# Patient Record
Sex: Male | Born: 1990 | Race: Black or African American | Hispanic: No | Marital: Single
Health system: Southern US, Academic
[De-identification: ages and names within clinical notes are randomized; demographics above are authoritative.]

## PROBLEM LIST (undated history)

## (undated) ENCOUNTER — Ambulatory Visit
Payer: MEDICAID | Attending: Student in an Organized Health Care Education/Training Program | Primary: Student in an Organized Health Care Education/Training Program

## (undated) ENCOUNTER — Encounter: Attending: Family | Primary: Family

## (undated) ENCOUNTER — Telehealth

## (undated) ENCOUNTER — Encounter

## (undated) ENCOUNTER — Telehealth: Attending: Family | Primary: Family

## (undated) ENCOUNTER — Ambulatory Visit: Payer: PRIVATE HEALTH INSURANCE | Attending: Family | Primary: Family

## (undated) ENCOUNTER — Ambulatory Visit: Payer: Medicaid (Managed Care)

## (undated) ENCOUNTER — Ambulatory Visit

## (undated) ENCOUNTER — Ambulatory Visit: Payer: MEDICAID | Attending: Family | Primary: Family

## (undated) ENCOUNTER — Ambulatory Visit: Attending: Internal Medicine | Primary: Internal Medicine

## (undated) ENCOUNTER — Encounter: Attending: Psychiatry | Primary: Psychiatry

## (undated) ENCOUNTER — Ambulatory Visit: Payer: MEDICAID

## (undated) ENCOUNTER — Encounter
Attending: Student in an Organized Health Care Education/Training Program | Primary: Student in an Organized Health Care Education/Training Program

## (undated) ENCOUNTER — Encounter: Payer: Medicaid (Managed Care) | Attending: Medical | Primary: Medical

## (undated) ENCOUNTER — Ambulatory Visit: Attending: Family | Primary: Family

## (undated) ENCOUNTER — Ambulatory Visit: Payer: Medicaid (Managed Care) | Attending: Family | Primary: Family

## (undated) ENCOUNTER — Ambulatory Visit
Attending: Student in an Organized Health Care Education/Training Program | Primary: Student in an Organized Health Care Education/Training Program

## (undated) ENCOUNTER — Telehealth: Attending: Clinical | Primary: Clinical

## (undated) ENCOUNTER — Ambulatory Visit: Attending: Addiction (Substance Use Disorder) | Primary: Addiction (Substance Use Disorder)

## (undated) ENCOUNTER — Encounter: Payer: BLUE CROSS/BLUE SHIELD | Attending: Family | Primary: Family

## (undated) ENCOUNTER — Encounter: Payer: Medicaid (Managed Care) | Attending: Psychiatry | Primary: Psychiatry

## (undated) ENCOUNTER — Ambulatory Visit
Payer: PRIVATE HEALTH INSURANCE | Attending: Student in an Organized Health Care Education/Training Program | Primary: Student in an Organized Health Care Education/Training Program

## (undated) DIAGNOSIS — I10 Essential (primary) hypertension: Secondary | ICD-10-CM

## (undated) HISTORY — PX: ANKLE SURGERY: SHX546

## (undated) HISTORY — PX: APPENDECTOMY: SHX54

## (undated) HISTORY — PX: MENISCUS REPAIR: SHX5179

## (undated) HISTORY — PX: HERNIA REPAIR: SHX51

---
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
## 2017-03-30 ENCOUNTER — Emergency Department
Admission: EM | Admit: 2017-03-30 | Discharge: 2017-03-30 | Disposition: A | Discharge status: Home / Self Care | Source: Intra-hospital

## 2017-03-30 DIAGNOSIS — M25562 Pain in left knee: Principal | ICD-10-CM

## 2017-05-25 ENCOUNTER — Emergency Department
Admission: EM | Admit: 2017-05-25 | Discharge: 2017-05-25 | Disposition: A | Discharge status: Home / Self Care | Source: Intra-hospital

## 2017-05-25 ENCOUNTER — Emergency Department
Admission: EM | Admit: 2017-05-25 | Discharge: 2017-05-25 | Disposition: A | Discharge status: Home / Self Care | Source: Intra-hospital | Attending: Family | Admitting: Family

## 2017-05-25 DIAGNOSIS — R1011 Right upper quadrant pain: Principal | ICD-10-CM

## 2017-05-25 DIAGNOSIS — R1013 Epigastric pain: Secondary | ICD-10-CM

## 2017-05-25 MED ORDER — RANITIDINE 150 MG TABLET
ORAL_TABLET | Freq: Two times a day (BID) | ORAL | 0 refills | 0.00000 days | Status: CP
Start: 2017-05-25 — End: 2017-06-04

## 2017-05-30 ENCOUNTER — Emergency Department (HOSPITAL_COMMUNITY): Payer: Self-pay

## 2017-05-30 ENCOUNTER — Emergency Department (HOSPITAL_COMMUNITY)
Admission: EM | Admit: 2017-05-30 | Discharge: 2017-05-30 | Disposition: A | Discharge status: Home / Self Care | Payer: Self-pay | Attending: Emergency Medicine | Admitting: Emergency Medicine

## 2017-05-30 ENCOUNTER — Encounter (HOSPITAL_COMMUNITY): Payer: Self-pay | Admitting: Emergency Medicine

## 2017-05-30 DIAGNOSIS — R0989 Other specified symptoms and signs involving the circulatory and respiratory systems: Secondary | ICD-10-CM | POA: Insufficient documentation

## 2017-05-30 DIAGNOSIS — J020 Streptococcal pharyngitis: Secondary | ICD-10-CM | POA: Insufficient documentation

## 2017-05-30 LAB — RAPID STREP SCREEN (MED CTR MEBANE ONLY): STREPTOCOCCUS, GROUP A SCREEN (DIRECT): POSITIVE — AB

## 2017-05-30 MED ORDER — ACETAMINOPHEN 325 MG PO TABS
650.0000 mg | ORAL_TABLET | Freq: Once | ORAL | Status: AC | PRN
  Administered 2017-05-30: 650 mg via ORAL

## 2017-05-30 MED ORDER — PENICILLIN G BENZATHINE 1200000 UNIT/2ML IM SUSP
1.2000 10*6.[IU] | Freq: Once | INTRAMUSCULAR | Status: AC
  Administered 2017-05-30: 1.2 10*6.[IU] via INTRAMUSCULAR
  Filled 2017-05-30: qty 2

## 2017-05-30 MED ORDER — ACETAMINOPHEN 325 MG PO TABS
ORAL_TABLET | ORAL | Status: AC
  Filled 2017-05-30: qty 2

## 2017-05-30 MED ORDER — FLUTICASONE PROPIONATE 50 MCG/ACT NA SUSP: 1.0000 | Freq: Every day | NASAL | 0 refills | Status: DC

## 2017-05-30 NOTE — ED Notes (Signed)
PT states understanding of care given, follow up care, and medication prescribed. PT is ambulated from ED to car with a steady gait.  

## 2017-05-30 NOTE — ED Notes (Signed)
Pt tried to take Sudafed and alkerselcer  and nothing has helped.

## 2017-05-30 NOTE — ED Provider Notes (Signed)
[facility]-EMERGENCY DEPT Provider Note   CSN: [id number] Arrival date & time: 05/30/17  1500     History   Chief Complaint Chief Complaint  Patient presents with  . URI    HPI Jordan Grimes is a 26 y.o. male.  HPI  26 y.o. male, presents to the Emergency Department today due to URI symptoms x 2 days. Associated cough. Congestion, and sore throat. Notes cough is non productive. Notes sister sick last week with strep throat. No fevers. No CP/SOB. No headaches. No trouble swallowing. Able to tolerate PO. Denies pain. No meds PTA. No N/V/D. No other symptoms noted.    History reviewed. No pertinent past medical history.  There are no active problems to display for this patient.   History reviewed. No pertinent surgical history.     Home Medications    Prior to Admission medications   Not on File    Family History History reviewed. No pertinent family history.  Social History Social History  Substance Use Topics  . Smoking status: Never Smoker  . Smokeless tobacco: Never Used  . Alcohol use Yes     Comment: social     Allergies   Patient has no allergy information on record.   Review of Systems Review of Systems ROS reviewed and all are negative for acute change except as noted in the HPI.  Physical Exam Updated Vital Signs BP (!) 143/91 (BP Location: Right Arm)   Pulse 96   Temp 99.6 F (37.6 C) (Oral)   Resp 20   Ht  (1.854 m)   Wt 114.8 kg (253 lb)   SpO2 99%   BMI 33.38 kg/m   Physical Exam  Constitutional: He is oriented to person, place, and time. He appears well-developed and well-nourished. No distress.  HENT:  Head: Normocephalic and atraumatic.  Right Ear: Tympanic membrane, external ear and ear canal normal.  Left Ear: Tympanic membrane, external ear and ear canal normal.  Nose: Nose normal.  Mouth/Throat: Uvula is midline and mucous membranes are normal. No trismus in the jaw. Oropharyngeal exudate and posterior  oropharyngeal erythema present. No tonsillar abscesses.  Eyes: Pupils are equal, round, and reactive to light. EOM are normal.  Neck: Normal range of motion. Neck supple. No tracheal deviation present.  Cardiovascular: Normal rate, regular rhythm, S1 normal, S2 normal, normal heart sounds, intact distal pulses and normal pulses.   Pulmonary/Chest: Effort normal and breath sounds normal. No respiratory distress. He has no decreased breath sounds. He has no wheezes. He has no rhonchi. He has no rales.  Abdominal: Normal appearance and bowel sounds are normal. There is no tenderness.  Musculoskeletal: Normal range of motion.  Neurological: He is alert and oriented to person, place, and time.  Skin: Skin is warm and dry.  Psychiatric: He has a normal mood and affect. His speech is normal and behavior is normal. Thought content normal.  Nursing note and vitals reviewed.    ED Treatments / Results  Labs (all labs ordered are listed, but only abnormal results are displayed) Labs Reviewed  RAPID STREP SCREEN (NOT AT [facility]) - Abnormal; Notable for the following:       Result Value   Streptococcus, Group A Screen (Direct) POSITIVE (*)    All other components within normal limits    EKG  EKG Interpretation None       Radiology Dg Chest 2 View  Result Date: 05/30/2017 CLINICAL DATA:  Chest pain, shortness of breath and productive cough since  this morning. EXAM: CHEST  2 VIEW COMPARISON:  None. FINDINGS: Poor inspiration. Normal sized heart. Mild central peribronchial thickening. No airspace consolidation. Unremarkable bones. IMPRESSION: Mild bronchitic changes. Electronically Signed   By: Beckie Salts M.D.   On: 05/30/2017 16:13    Procedures Procedures (including critical care time)  Medications Ordered in ED Medications  acetaminophen (TYLENOL) 325 MG tablet (not administered)  acetaminophen (TYLENOL) tablet 650 mg (650 mg Oral Given 05/30/17 1547)     Initial Impression /  Assessment and Plan / ED Course  I have reviewed the triage vital signs and the nursing notes.  Pertinent labs & imaging results that were available during my care of the patient were reviewed by me and considered in my medical decision making (see chart for details).  Final Clinical Impressions(s) / ED Diagnoses   {I have reviewed and evaluated the relevant imaging studies.  {I have reviewed the relevant previous healthcare records.  {I obtained HPI from historian.   ED Course:  Assessment:Pt with positive strep. Given Bicillin IM infection. Discharge with symptomatic tx. No evidence of dehydration. Pt is tolerating secretions. Presentation not concerning for peritonsillar abscess or spread of infection to deep spaces of the throat; patent airway. Specific return precautions discussed. Recommended PCP follow up. Pt appears safe for discharge.   Disposition/Plan:  DC Home Additional Verbal discharge instructions given and discussed with patient.  Pt Instructed to f/u with PCP in the next week for evaluation and treatment of symptoms. Return precautions given Pt acknowledges and agrees with plan  Supervising Physician Vanetta Mulders, MD  Final diagnoses:  Strep pharyngitis    New Prescriptions New Prescriptions   No medications on file     Audry Pili, Cordelia Poche 05/30/17 1804    Vanetta Mulders, MD 05/31/17 1651

## 2017-05-30 NOTE — ED Triage Notes (Signed)
Pt presents to ED for assessment of cough, cold chills, sore throat, headache, and body aches x 2 days.

## 2017-05-30 NOTE — Discharge Instructions (Signed)
Please read and follow all provided instructions.  Your diagnoses today include:  1. Strep pharyngitis     Tests performed today include: Vital signs. See below for your results today.   Medications prescribed:  Take as prescribed   Home care instructions:  Follow any educational materials contained in this packet.  Follow-up instructions: Please follow-up with your primary care provider for further evaluation of symptoms and treatment   Return instructions:  Please return to the Emergency Department if you do not get better, if you get worse, or new symptoms OR  - Fever (temperature greater than 101.14F)  - Bleeding that does not stop with holding pressure to the area    -Severe pain (please note that you may be more sore the day after your accident)  - Chest Pain  - Difficulty breathing  - Severe nausea or vomiting  - Inability to tolerate food and liquids  - Passing out  - Skin becoming red around your wounds  - Change in mental status (confusion or lethargy)  - New numbness or weakness    Please return if you have any other emergent concerns.  Additional Information:  Your vital signs today were: BP (!) 143/91 (BP Location: Right Arm)    Pulse 96    Temp 99.6 F (37.6 C) (Oral)    Resp 20    Ht  (1.854 m)    Wt 114.8 kg (253 lb)    SpO2 99%    BMI 33.38 kg/m  If your blood pressure (BP) was elevated above 135/85 this visit, please have this repeated by your doctor within one month. ---------------

## 2017-06-12 ENCOUNTER — Emergency Department (HOSPITAL_COMMUNITY)
Admission: EM | Admit: 2017-06-12 | Discharge: 2017-06-12 | Disposition: A | Discharge status: Home / Self Care | Payer: Self-pay | Attending: Emergency Medicine | Admitting: Emergency Medicine

## 2017-06-12 ENCOUNTER — Emergency Department (HOSPITAL_COMMUNITY): Payer: Self-pay

## 2017-06-12 ENCOUNTER — Encounter (HOSPITAL_COMMUNITY): Payer: Self-pay | Admitting: *Deleted

## 2017-06-12 DIAGNOSIS — J40 Bronchitis, not specified as acute or chronic: Secondary | ICD-10-CM | POA: Insufficient documentation

## 2017-06-12 DIAGNOSIS — L0201 Cutaneous abscess of face: Secondary | ICD-10-CM | POA: Insufficient documentation

## 2017-06-12 DIAGNOSIS — L0291 Cutaneous abscess, unspecified: Secondary | ICD-10-CM

## 2017-06-12 MED ORDER — LIDOCAINE HCL 2 % IJ SOLN
10.0000 mL | Freq: Once | INTRAMUSCULAR | Status: AC
  Administered 2017-06-12: 200 mg
  Filled 2017-06-12: qty 20

## 2017-06-12 MED ORDER — LIDOCAINE-EPINEPHRINE-TETRACAINE (LET) SOLUTION
3.0000 mL | Freq: Once | NASAL | Status: AC
  Administered 2017-06-12: 3 mL via TOPICAL
  Filled 2017-06-12: qty 3

## 2017-06-12 MED ORDER — BENZONATATE 100 MG PO CAPS: 100.0000 mg | ORAL_CAPSULE | Freq: Three times a day (TID) | ORAL | 0 refills | Status: DC

## 2017-06-12 NOTE — ED Triage Notes (Signed)
To ED for eval of URI symptoms and also raised area to left eyebrow without drainage.

## 2017-06-12 NOTE — ED Provider Notes (Signed)
[facility] EMERGENCY DEPARTMENT Provider Note   CSN: [id number] Arrival date & time: 06/12/17  1520     History   Chief Complaint Chief Complaint  Patient presents with  . Abscess  . Nasal Congestion    HPI Jordan Grimes is a 26 y.o. male.  HPI   Jordan Grimes is a 26 y.o. male, patient with recent diagnosis of strep with treatment, presenting to the ED with productive cough with nasal congestion for last week.  Also complains of a bump over left eye brow for last three days. Not growing. Tender to palpation, but no pain otherwise. No drainage. Tetanus up-to-date Denies chest pain, shortness of breath, N/V/D, fever/chills, sore throat, or any other complaints.   History reviewed. No pertinent past medical history.  There are no active problems to display for this patient.   History reviewed. No pertinent surgical history.     Home Medications    Prior to Admission medications   Medication Sig Start Date End Date Taking? Authorizing Provider  benzonatate (TESSALON) 100 MG capsule Take 1 capsule (100 mg total) by mouth every 8 (eight) hours. 06/12/17   Gladyce Mcray C, PA-C  fluticasone (FLONASE) 50 MCG/ACT nasal spray Place 1 spray into both nostrils daily. 05/30/17   Audry Pili, PA-C    Family History No family history on file.  Social History Social History  Substance Use Topics  . Smoking status: Never Smoker  . Smokeless tobacco: Never Used  . Alcohol use Yes     Comment: social     Allergies   Patient has no known allergies.   Review of Systems Review of Systems  Constitutional: Negative for chills and fever.  HENT: Positive for congestion. Negative for facial swelling, sore throat, trouble swallowing and voice change.   Eyes: Negative for pain, redness and visual disturbance.  Respiratory: Positive for cough. Negative for shortness of breath.   Skin:       Tender bump above left eyebrow  Neurological: Negative  for dizziness, weakness, light-headedness, numbness and headaches.  All other systems reviewed and are negative.    Physical Exam Updated Vital Signs BP (!) 153/97 (BP Location: Left Arm)   Pulse 79   Temp 98.4 F (36.9 C) (Oral)   Resp 19   SpO2 97%   Physical Exam  Constitutional: He appears well-developed and well-nourished. No distress.  HENT:  Head: Normocephalic and atraumatic.  Nose: Mucosal edema present.  Eyes: Pupils are equal, round, and reactive to light. Conjunctivae and EOM are normal.  Neck: Neck supple.  Cardiovascular: Normal rate, regular rhythm, normal heart sounds and intact distal pulses.   Pulmonary/Chest: Effort normal and breath sounds normal. No respiratory distress.  No increased work of breathing.  Patient speaks in full sentences without difficulty.  Abdominal: Soft. There is no tenderness. There is no guarding.  Musculoskeletal: He exhibits no edema.  Lymphadenopathy:    He has no cervical adenopathy.  Neurological: He is alert.  Skin: Skin is warm and dry. Capillary refill takes less than 2 seconds. He is not diaphoretic.  1.5 cm tender, fluctuant mass superior to left eyebrow.  No surrounding edema or erythema noted.  Psychiatric: He has a normal mood and affect. His behavior is normal.  Nursing note and vitals reviewed.    ED Treatments / Results  Labs (all labs ordered are listed, but only abnormal results are displayed) Labs Reviewed - No data to display  EKG  EKG Interpretation None  Radiology Results for orders placed or performed during the hospital encounter of 05/30/17  Rapid strep screen  Result Value Ref Range   Streptococcus, Group A Screen (Direct) POSITIVE (A) NEGATIVE   Dg Chest 2 View  Result Date: 06/12/2017 CLINICAL DATA:  Productive cough EXAM: CHEST  2 VIEW COMPARISON:  05/30/2017 FINDINGS: Minimal bronchitic changes. No consolidation or effusion. Normal heart size. No pneumothorax. IMPRESSION: Mild  bronchitic changes.  No focal pulmonary infiltrate. Electronically Signed   By: Jasmine PangKim  Fujinaga M.D.   On: 06/12/2017 18:07   Dg Chest 2 View  Result Date: 05/30/2017 CLINICAL DATA:  Chest pain, shortness of breath and productive cough since this morning. EXAM: CHEST  2 VIEW COMPARISON:  None. FINDINGS: Poor inspiration. Normal sized heart. Mild central peribronchial thickening. No airspace consolidation. Unremarkable bones. IMPRESSION: Mild bronchitic changes. Electronically Signed   By: Beckie SaltsSteven  Reid M.D.   On: 05/30/2017 16:13    Procedures US bedside Date/Time: 06/12/2017 5:38 PM Performed by: Anselm PancoastJOY, Viraat Vanpatten C Authorized by: Harolyn RutherfordJOY, Ritika Hellickson C  Consent: Verbal consent obtained. Risks and benefits: risks, benefits and alternatives were discussed Consent given by: patient Patient identity confirmed: verbally with patient and provided demographic data Local anesthesia used: no  Anesthesia: Local anesthesia used: no  Sedation: Patient sedated: no Comments: Fluid filled 1.5cm mass, suspicious for abscess, with no evidence of surrounding cellulitis.  Marland Kitchen..Incision and Drainage Date/Time: 06/12/2017 5:39 PM Performed by: Anselm PancoastJOY, Dohn Stclair C Authorized by: Anselm PancoastJOY, Essie Gehret C   Consent:    Consent obtained:  Verbal   Consent given by:  Patient   Risks discussed:  Infection, bleeding, pain and incomplete drainage Location:    Type:  Abscess   Size:  1.5 cm   Location:  Head   Head location:  Face Pre-procedure details:    Skin preparation:  Chloraprep Anesthesia (see MAR for exact dosages):    Anesthesia method:  Local infiltration   Local anesthetic:  Lidocaine 2% w/o epi Procedure type:    Complexity:  Simple Procedure details:    Needle aspiration: yes     Needle size:  18 G   Incision types:  Stab incision   Incision depth:  Subcutaneous   Scalpel blade:  11   Drainage:  Purulent   Drainage amount:  Scant   Wound treatment:  Wound left open   Packing materials:  None Post-procedure details:     Patient tolerance of procedure:  Tolerated well, no immediate complications   (including critical care time)  Medications Ordered in ED Medications  lidocaine-EPINEPHrine-tetracaine (LET) solution (3 mLs Topical Given 06/12/17 1804)  lidocaine (XYLOCAINE) 2 % (with pres) injection 200 mg (200 mg Infiltration Given 06/12/17 1805)     Initial Impression / Assessment and Plan / ED Course  I have reviewed the triage vital signs and the nursing notes.  Pertinent labs & imaging results that were available during my care of the patient were reviewed by me and considered in my medical decision making (see chart for details).     Patient presents with symptoms consistent with URI versus bronchitis, viral origin still suspected.  Facial abscess identified and confirmed with ultrasound.  I&D without immediate complication.  Antibiotic therapy not initiated due to no evidence of surrounding cellulitis.  Case management assistance utilized to help patient secure a PCP. The patient was given instructions for home care as well as return precautions. Patient voices understanding of these instructions, accepts the plan, and is comfortable with discharge.     Final Clinical  Impressions(s) / ED Diagnoses   Final diagnoses:  Bronchitis  Abscess    New Prescriptions Discharge Medication List as of 06/12/2017  7:53 PM    START taking these medications   Details  benzonatate (TESSALON) 100 MG capsule Take 1 capsule (100 mg total) by mouth every 8 (eight) hours., Starting Thu 06/12/2017, Print         Adriel Desrosier C, PA-C 06/15/17 1607    Gerhard Munch, MD 06/16/17 [id number]

## 2017-06-12 NOTE — Discharge Instructions (Addendum)
Your symptoms are consistent with a viral illness. Viruses do not require antibiotics. Treatment is symptomatic care and it is important to note that these symptoms may last for 7-14 days.   Hand washing: Wash your hands throughout the day, but especially before and after touching the face, using the restroom, sneezing, coughing, or touching surfaces that have been coughed or sneezed upon. Hydration: Symptoms will be intensified and complicated by dehydration. Dehydration can also extend the duration of symptoms. Drink plenty of fluids and get plenty of rest. You should be drinking at least half a liter of water an hour to stay hydrated. Electrolyte drinks are also encouraged. You should be drinking enough fluids to make your urine light yellow, almost clear. If this is not the case, you are not drinking enough water. Please note that some of the treatments indicated below will not be effective if you are not adequately hydrated. Pain or fever: Ibuprofen, Naproxen, or Tylenol for pain or fever.  Cough: Use the Tessalon for cough.  Congestion: Plain Mucinex may help relieve congestion. Saline sinus rinses and saline nasal sprays may also help relieve congestion. If you do not have heart problems or an allergy to such medications, you may also try phenylephrine or Sudafed. Sore throat: Warm liquids or Chloraseptic spray may help soothe a sore throat. Gargle twice a day with a salt water solution made from a half teaspoon of salt in a cup of warm water.  Follow up: Follow up with a primary care provider, as needed, for any future management of this issue.  You may remove the bandage after 24 hours if the wound has stopped draining, otherwise replace the bandage with a clean one. Clean the wound and surrounding area gently with tap water and mild soap. Rinse well and blot dry. You may shower, but avoid submerging the wound, such as with a bath or swimming. Clean the wound daily to prevent infection. Do not  use cleaners such as hydrogen peroxide or alcohol.   Scar reduction: Application of a topical antibiotic ointment, such as Neosporin, after the wound has begun to close and heal well can decrease scab formation and reduce scarring. After the wound has healed and wound closures have been removed, application of ointments such as Aquaphor can also reduce scar formation.  The key to scar reduction is keeping the skin well hydrated and supple. Drinking plenty of water throughout the day (At least eight 8oz glasses of water a day) is essential to staying well hydrated.  Sun exposure: Keep the wound out of the sun. After the wound has healed, continue to protect it from the sun by wearing protective clothing or applying sunscreen.  Pain: You may use Tylenol, naproxen, or ibuprofen for pain.  You may return to the ED for a wound check in 48 hours.  Return to the ED sooner further signs of infection arise, such as spreading redness, puffiness/swelling, severe increase in pain, fever over 100.1F, or any other major issues.

## 2017-06-12 NOTE — Progress Notes (Signed)
ED CM noted patient to have had 2 ED visits in the past 6 months and no PCP or health insurance listed. CM met with patient at bedside to discuss assistance with access to care. Patient reports not having health insurance or a PCP at this time. Discussed the [facility] and the services rendered patient is agreeable with scheduling a follow up and establishing care at the clinic. Patient given clinic contact information for follow up tomorrow morning. Patient verbalized understanding and teach back done.  Updated Pod C EDP. No further ED CM needs identified.

## 2017-09-05 ENCOUNTER — Encounter (HOSPITAL_BASED_OUTPATIENT_CLINIC_OR_DEPARTMENT_OTHER): Payer: Self-pay

## 2017-09-05 ENCOUNTER — Other Ambulatory Visit: Payer: Self-pay

## 2017-09-05 ENCOUNTER — Emergency Department (HOSPITAL_BASED_OUTPATIENT_CLINIC_OR_DEPARTMENT_OTHER)
Admission: EM | Admit: 2017-09-05 | Discharge: 2017-09-05 | Disposition: A | Discharge status: Home / Self Care | Payer: Self-pay | Attending: Emergency Medicine | Admitting: Emergency Medicine

## 2017-09-05 DIAGNOSIS — R5383 Other fatigue: Secondary | ICD-10-CM | POA: Insufficient documentation

## 2017-09-05 DIAGNOSIS — J069 Acute upper respiratory infection, unspecified: Secondary | ICD-10-CM | POA: Insufficient documentation

## 2017-09-05 DIAGNOSIS — R0989 Other specified symptoms and signs involving the circulatory and respiratory systems: Secondary | ICD-10-CM | POA: Insufficient documentation

## 2017-09-05 DIAGNOSIS — J029 Acute pharyngitis, unspecified: Secondary | ICD-10-CM | POA: Insufficient documentation

## 2017-09-05 LAB — RAPID STREP SCREEN (MED CTR MEBANE ONLY): Streptococcus, Group A Screen (Direct): NEGATIVE

## 2017-09-05 NOTE — ED Provider Notes (Signed)
[facility] EMERGENCY DEPARTMENT Provider Note   CSN: [id number] Arrival date & time: 09/05/17  1949     History   Chief Complaint Chief Complaint  Patient presents with  . Cough    HPI Jordan Grimes is a 27 y.o. male who presents for  Sxs of URI. Began yesterday. Cough, associated fatigue, runny nose, sore throat. No fever, sob.  HPI  History reviewed. No pertinent past medical history.  There are no active problems to display for this patient.   Past Surgical History:  Procedure Laterality Date  . ANKLE SURGERY    . APPENDECTOMY    . HERNIA REPAIR    . MENISCUS REPAIR         Home Medications    Prior to Admission medications   Not on File    Family History No family history on file.  Social History Social History   Tobacco Use  . Smoking status: Never Smoker  . Smokeless tobacco: Never Used  Substance Use Topics  . Alcohol use: Yes    Comment: occ  . Drug use: No     Allergies   Patient has no known allergies.   Review of Systems Review of Systems  Constitutional: Positive for fatigue. Negative for appetite change, chills and fever.  HENT: Positive for congestion and sore throat. Negative for trouble swallowing and voice change.   Respiratory: Positive for cough. Negative for shortness of breath and wheezing.      Physical Exam Updated Vital Signs BP (!) 152/96 (BP Location: Right Arm)   Pulse 88   Temp 99 F (37.2 C) (Oral)   Resp 18   Ht 6\' 1"  (1.854 m)   Wt 115.2 kg (254 lb)   SpO2 99%   BMI 33.51 kg/m   Physical Exam  Constitutional: He appears well-developed and well-nourished. No distress.  HENT:  Head: Normocephalic and atraumatic.  Mouth/Throat: Posterior oropharyngeal erythema present. Tonsils are 2+ on the right. Tonsils are 2+ on the left. No tonsillar exudate.  Eyes: EOM are normal. Pupils are equal, round, and reactive to light. Right conjunctiva is injected. Left conjunctiva is injected. No  scleral icterus.  Neck: Normal range of motion. Neck supple.  Cardiovascular: Normal rate, regular rhythm and normal heart sounds.  Pulmonary/Chest: Effort normal and breath sounds normal. No respiratory distress.  Abdominal: Soft. There is no tenderness.  Musculoskeletal: He exhibits no edema.  Neurological: He is alert.  Skin: Skin is warm and dry. He is not diaphoretic.  Psychiatric: His behavior is normal.  Nursing note and vitals reviewed.    ED Treatments / Results  Labs (all labs ordered are listed, but only abnormal results are displayed) Labs Reviewed - No data to display  EKG  EKG Interpretation None       Radiology No results found.  Procedures Procedures (including critical care time)  Medications Ordered in ED Medications - No data to display   Initial Impression / Assessment and Plan / ED Course  I have reviewed the triage vital signs and the nursing notes.  Pertinent labs & imaging results that were available during my care of the patient were reviewed by me and considered in my medical decision making (see chart for details).     Negative strep. Pt CXR negative for acute infiltrate. Patients symptoms are consistent with URI, likely viral etiology. Discussed that antibiotics are not indicated for viral infections. Pt will be discharged with symptomatic treatment.  Verbalizes understanding and is agreeable with  plan. Pt is hemodynamically stable & in NAD prior to dc.    Final Clinical Impressions(s) / ED Diagnoses   Final diagnoses:  Upper respiratory tract infection, unspecified type    ED Discharge Orders    None       Arthor Captain, PA-C 09/05/17 2118    Gwyneth Sprout, MD 09/06/17 1101

## 2017-09-05 NOTE — ED Triage Notes (Signed)
C/o flu like sx day 2-NAD-steady gait 

## 2017-09-05 NOTE — Discharge Instructions (Signed)
You appear to have an upper respiratory infection (URI). An upper respiratory tract infection, or cold, is a viral infection of the air passages leading to the lungs. It is contagious and can be spread to others, especially during the first 3 or 4 days. It cannot be cured by antibiotics or other medicines. °RETURN IMMEDIATELY IF you develop shortness of breath, confusion or altered mental status, a new rash, become dizzy, faint, or poorly responsive, or are unable to be cared for at home. ° °

## 2017-09-08 LAB — CULTURE, GROUP A STREP (THRC)

## 2017-11-28 ENCOUNTER — Encounter (HOSPITAL_BASED_OUTPATIENT_CLINIC_OR_DEPARTMENT_OTHER): Payer: Self-pay

## 2017-11-28 ENCOUNTER — Emergency Department (HOSPITAL_BASED_OUTPATIENT_CLINIC_OR_DEPARTMENT_OTHER)
Admission: EM | Admit: 2017-11-28 | Discharge: 2017-11-28 | Disposition: A | Discharge status: Home / Self Care | Payer: Worker's Compensation | Attending: Emergency Medicine | Admitting: Emergency Medicine

## 2017-11-28 ENCOUNTER — Emergency Department (HOSPITAL_BASED_OUTPATIENT_CLINIC_OR_DEPARTMENT_OTHER): Payer: Worker's Compensation

## 2017-11-28 ENCOUNTER — Other Ambulatory Visit: Payer: Self-pay

## 2017-11-28 DIAGNOSIS — M79641 Pain in right hand: Secondary | ICD-10-CM

## 2017-11-28 DIAGNOSIS — I10 Essential (primary) hypertension: Secondary | ICD-10-CM

## 2017-11-28 NOTE — Discharge Instructions (Signed)
While in the ED your blood pressure was high.  Please follow up with your primary care doctor or the wellness clinic for repeat evaluation as you may need medication.  High blood pressure can cause long term, potentially serious, damage if left untreated.  ° °Please take Ibuprofen (Advil, motrin) and Tylenol (acetaminophen) to relieve your pain.  You may take up to 600 MG (3 pills) of normal strength ibuprofen every 8 hours as needed.  In between doses of ibuprofen you make take tylenol, up to 1,000 mg (two extra strength pills).  Do not take more than 3,000 mg tylenol in a 24 hour period.  Please check all medication labels as many medications such as pain and cold medications may contain tylenol.  Do not drink alcohol while taking these medications.  Do not take other NSAID'S while taking ibuprofen (such as aleve or naproxen).  Please take ibuprofen with food to decrease stomach upset. ° ° °

## 2017-11-28 NOTE — ED Provider Notes (Signed)
[facility] EMERGENCY DEPARTMENT Provider Note   CSN: [id number] Arrival date & time: 11/28/17  1742     History   Chief Complaint Chief Complaint  Patient presents with  . Hand Injury    HPI Jordan Grimes is a 27 y.o. male who presents today for evaluation of right hand pain.  He reports that a forklift closed on his right hand at work approximately 3 hours prior to arrival.  He reports that he did not want to come, however his job told him he needed to.  He denies any numbness or tingling.  He reports that the back of his hand feels tight.  He has not taken anything prior to arrival for his symptoms.  He reports his last tetanus is within the past 5 years.  HPI  History reviewed. No pertinent past medical history.  There are no active problems to display for this patient.   Past Surgical History:  Procedure Laterality Date  . ANKLE SURGERY    . APPENDECTOMY    . HERNIA REPAIR    . MENISCUS REPAIR          Home Medications    Prior to Admission medications   Not on File    Family History No family history on file.  Social History Social History   Tobacco Use  . Smoking status: Never Smoker  . Smokeless tobacco: Never Used  Substance Use Topics  . Alcohol use: Yes    Comment: occ  . Drug use: No     Allergies   Patient has no known allergies.   Review of Systems Review of Systems  Constitutional: Negative for chills and fever.  Musculoskeletal:       Right hand swelling  Skin: Positive for wound.     Physical Exam Updated Vital Signs BP (!) 145/98 (BP Location: Left Arm)   Pulse 85   Temp 99.1 F (37.3 C) (Oral)   Resp 18   Ht 6\' 1"  (1.854 m)   Wt 122.7 kg (270 lb 8.1 oz)   SpO2 99%   BMI 35.69 kg/m   Physical Exam  Constitutional: He appears well-developed and well-nourished.  HENT:  Head: Normocephalic and atraumatic.  Cardiovascular: Intact distal pulses.  2+ right radial pulse.  Brisk capillary refill on  right fingers and hand.  Musculoskeletal:  Patient has full active range of motion of his right hand and fingers.  He does report mild pain and pulling feeling with finger flexion.  Mild edema across the dorsum thumb and index finger in the webspace in between.   Hand is not tight, easily compressible.  No scaphoid TTP.   Neurological:  Sensation intact to right hand and fingers.  Skin: He is not diaphoretic.  5mm very superficial skin tear on the R index finger, no bleeding.   Nursing note and vitals reviewed.    ED Treatments / Results  Labs (all labs ordered are listed, but only abnormal results are displayed) Labs Reviewed - No data to display  EKG None  Radiology Dg Hand Complete Right  Result Date: 11/28/2017 CLINICAL DATA:  Thumb and index finger pain after forklift injury. EXAM: RIGHT HAND - COMPLETE 3+ VIEW COMPARISON:  None. FINDINGS: There is no evidence of fracture or dislocation. There is no evidence of arthropathy or other focal bone abnormality. Soft tissues are unremarkable. IMPRESSION: Negative. Electronically Signed   By: Obie DredgeWilliam T Derry M.D.   On: 11/28/2017 18:13    Procedures Procedures (including critical care  time)  Medications Ordered in ED Medications - No data to display   Initial Impression / Assessment and Plan / ED Course  I have reviewed the triage vital signs and the nursing notes.  Pertinent labs & imaging results that were available during my care of the patient were reviewed by me and considered in my medical decision making (see chart for details).    Patient presents today for evaluation after a forklift reportedly closed on his right hand.  X-rays were obtained without acute abnormalities.  Hand is soft, easily compressible with full ROM.  R hand is vascularly intact.  Subcentimeter very superficial skin tear on right index finger.  Patient was instructed on conservative care, rice, over-the-counter pain management.  He was given a work note.   He was instructed that if his pain does not resolve in 1 week then he should follow-up with hand surgery.  While patient was here he was noted to have hypertension.  He was informed of this, given information on the DASH diet and stated his understanding. No evidence of hypertensive urgency or emergency.  Discharged home. Return precautions discussed.    Final Clinical Impressions(s) / ED Diagnoses   Final diagnoses:  Right hand pain  Hypertension, unspecified type    ED Discharge Orders    None       Norman Clay 11/28/17 1850    Gwyneth Sprout, MD 11/28/17 2359

## 2017-11-28 NOTE — ED Triage Notes (Signed)
Pt c/o "forklift closed on my hand at work" approx 3 hours PTA-slight abrasion noted-no bleeding-NAD-steady gait-no paperwork with pt-advised to call and ask a supervisor if needs post UDS

## 2017-11-28 NOTE — ED Notes (Signed)
ED Provider at bedside. 

## 2018-01-01 ENCOUNTER — Encounter
Admit: 2018-01-01 | Discharge: 2018-01-01 | Disposition: A | Discharge status: Home / Self Care | Payer: BLUE CROSS/BLUE SHIELD

## 2018-01-01 DIAGNOSIS — R1110 Vomiting, unspecified: Principal | ICD-10-CM

## 2018-01-01 MED ORDER — DICYCLOMINE 20 MG TABLET
ORAL_TABLET | Freq: Two times a day (BID) | ORAL | 0 refills | 0 days | Status: CP
Start: 2018-01-01 — End: 2018-01-06

## 2018-01-01 MED ORDER — ONDANSETRON 4 MG DISINTEGRATING TABLET
ORAL_TABLET | Freq: Three times a day (TID) | ORAL | 0 refills | 0.00000 days | Status: CP | PRN
Start: 2018-01-01 — End: 2018-01-06

## 2018-06-29 ENCOUNTER — Emergency Department (HOSPITAL_BASED_OUTPATIENT_CLINIC_OR_DEPARTMENT_OTHER)
Admission: EM | Admit: 2018-06-29 | Discharge: 2018-06-29 | Disposition: A | Discharge status: Home / Self Care | Payer: Medicaid Other | Attending: Emergency Medicine | Admitting: Emergency Medicine

## 2018-06-29 ENCOUNTER — Encounter (HOSPITAL_BASED_OUTPATIENT_CLINIC_OR_DEPARTMENT_OTHER): Payer: Self-pay

## 2018-06-29 ENCOUNTER — Other Ambulatory Visit: Payer: Self-pay

## 2018-06-29 ENCOUNTER — Emergency Department (HOSPITAL_BASED_OUTPATIENT_CLINIC_OR_DEPARTMENT_OTHER): Payer: Medicaid Other

## 2018-06-29 DIAGNOSIS — J189 Pneumonia, unspecified organism: Secondary | ICD-10-CM

## 2018-06-29 DIAGNOSIS — J181 Lobar pneumonia, unspecified organism: Secondary | ICD-10-CM | POA: Insufficient documentation

## 2018-06-29 MED ORDER — ALBUTEROL SULFATE HFA 108 (90 BASE) MCG/ACT IN AERS: 2.0000 | INHALATION_SPRAY | RESPIRATORY_TRACT | 0 refills | Status: AC | PRN

## 2018-06-29 MED ORDER — BENZONATATE 100 MG PO CAPS: 100.0000 mg | ORAL_CAPSULE | Freq: Three times a day (TID) | ORAL | 0 refills | Status: DC

## 2018-06-29 MED ORDER — AZITHROMYCIN 250 MG PO TABS: 250.0000 mg | ORAL_TABLET | Freq: Every day | ORAL | 0 refills | Status: DC

## 2018-06-29 MED FILL — BENZONATATE 100 MG CAPSULE: 100 | 7 days supply | Qty: 21 | Fill #0

## 2018-06-29 MED FILL — AZITHROMYCIN 250 MG TABLET: 250 | 5 days supply | Qty: 6 | Fill #0

## 2018-06-29 MED FILL — PROVENTIL HFA 108 (90 BASE): 108 (90 BAS | 17 days supply | Qty: 7 | Fill #0

## 2018-06-29 NOTE — ED Notes (Signed)
Patient transported to X-ray 

## 2018-06-29 NOTE — ED Notes (Signed)
Family at bedside. 

## 2018-06-29 NOTE — ED Provider Notes (Signed)
[facility] EMERGENCY DEPARTMENT Provider Note   CSN: [id number] Arrival date & time: 06/29/18  1611     History   Chief Complaint Chief Complaint  Patient presents with  . Cough    HPI Jordan Grimes is a 27 y.o. male.  HPI   Jordan Grimes is a 27 y.o. male, patient with no pertinent past medical history, presenting to the ED with cough for the past 2 days.  Accompanied by nasal congestion and fever.  Tenderness in the chest with cough. Has taken over-the-counter medications, such as Alka-Seltzer cold and flu. Denies shortness of breath, N/V/D, abdominal pain, or any other complaints.     History reviewed. No pertinent past medical history.  There are no active problems to display for this patient.   Past Surgical History:  Procedure Laterality Date  . ANKLE SURGERY    . APPENDECTOMY    . HERNIA REPAIR    . MENISCUS REPAIR          Home Medications    Prior to Admission medications   Medication Sig Start Date End Date Taking? Authorizing Provider  albuterol (PROVENTIL HFA;VENTOLIN HFA) 108 (90 Base) MCG/ACT inhaler Inhale 2 puffs into the lungs every 4 (four) hours as needed for wheezing or shortness of breath. 06/29/18   Joy, Shawn C, PA-C  azithromycin (ZITHROMAX) 250 MG tablet Take 1 tablet (250 mg total) by mouth daily. Take first 2 tablets together, then 1 every day until finished. 06/29/18   Joy, Shawn C, PA-C  benzonatate (TESSALON) 100 MG capsule Take 1 capsule (100 mg total) by mouth every 8 (eight) hours. 06/29/18   Anselm Pancoast, PA-C    Family History No family history on file.  Social History Social History   Tobacco Use  . Smoking status: Never Smoker  . Smokeless tobacco: Never Used  Substance Use Topics  . Alcohol use: Yes    Comment: occ  . Drug use: No     Allergies   Patient has no known allergies.   Review of Systems Review of Systems  Constitutional: Positive for fever.  HENT: Positive for  congestion and rhinorrhea. Negative for trouble swallowing and voice change.   Respiratory: Positive for cough. Negative for shortness of breath.   Gastrointestinal: Negative for abdominal pain, diarrhea, nausea and vomiting.  Musculoskeletal: Negative for neck pain and neck stiffness.  All other systems reviewed and are negative.    Physical Exam Updated Vital Signs BP 140/89 (BP Location: Right Arm)   Pulse 99   Temp 98.8 F (37.1 C) (Oral)   Resp 18   SpO2 100%   Physical Exam  Constitutional: He appears well-developed and well-nourished. No distress.  HENT:  Head: Normocephalic and atraumatic.  Nose: Mucosal edema and rhinorrhea present.  Mouth/Throat: Oropharynx is clear and moist.  Eyes: Conjunctivae are normal.  Neck: Neck supple.  Cardiovascular: Normal rate, regular rhythm and intact distal pulses.  Pulmonary/Chest: Effort normal and breath sounds normal. No respiratory distress.  No increased work of breathing.  Speaks in full sentences without difficulty.  Abdominal: Soft. There is no tenderness. There is no guarding.  Musculoskeletal: He exhibits no edema.  Lymphadenopathy:    He has no cervical adenopathy.  Neurological: He is alert.  Skin: Skin is warm and dry. He is not diaphoretic.  Psychiatric: He has a normal mood and affect. His behavior is normal.  Nursing note and vitals reviewed.    ED Treatments / Results  Labs (all labs  ordered are listed, but only abnormal results are displayed) Labs Reviewed - No data to display  EKG None  Radiology Dg Chest 2 View  Result Date: 06/29/2018 CLINICAL DATA:  Chest pain and fever EXAM: CHEST - 2 VIEW COMPARISON:  June 12, 2017 FINDINGS: There is subtle increased opacity in the right base, concerning for a focal area of pneumonia. Lungs elsewhere are clear. Heart size and pulmonary vascularity are normal. No adenopathy. No bone lesions. IMPRESSION: Subtle infiltrate right base, felt to represent pneumonia.  Lungs elsewhere clear. Cardiac silhouette normal. Electronically Signed   By: Bretta Bang III M.D.   On: 06/29/2018 16:42    Procedures Procedures (including critical care time)  Medications Ordered in ED Medications - No data to display   Initial Impression / Assessment and Plan / ED Course  I have reviewed the triage vital signs and the nursing notes.  Pertinent labs & imaging results that were available during my care of the patient were reviewed by me and considered in my medical decision making (see chart for details).     Patient presents with cough, fever, and congestion.  Evidence of pneumonia on chest x-ray.  Nontoxic-appearing, afebrile here in the ED, no increased work of breathing or distress. The patient was given instructions for home care as well as return precautions. Patient voices understanding of these instructions, accepts the plan, and is comfortable with discharge.    Final Clinical Impressions(s) / ED Diagnoses   Final diagnoses:  Community acquired pneumonia of right upper lobe of lung [facility])    ED Discharge Orders         Ordered    azithromycin (ZITHROMAX) 250 MG tablet  Daily     06/29/18 1658    benzonatate (TESSALON) 100 MG capsule  Every 8 hours     06/29/18 1658    albuterol (PROVENTIL HFA;VENTOLIN HFA) 108 (90 Base) MCG/ACT inhaler  Every 4 hours PRN     06/29/18 1658           Anselm Pancoast, PA-C 06/29/18 1704    Azalia Bilis, MD 06/29/18 1939

## 2018-06-29 NOTE — ED Triage Notes (Signed)
C/o flu like sx x 2 days-CP x today-NAD-steady gait

## 2018-06-29 NOTE — Discharge Instructions (Signed)
There was evidence of pneumonia on the chest x-ray.   Please take all of your antibiotics until finished!   You may develop abdominal discomfort or diarrhea from the antibiotic.  You may help offset this with probiotics which you can buy or get in yogurt. Do not eat or take the probiotics until 2 hours after your antibiotic.   Hand washing: Wash your hands throughout the day, but especially before and after touching the face, using the restroom, sneezing, coughing, or touching surfaces that have been coughed or sneezed upon. Hydration: Symptoms will be intensified and complicated by dehydration. Dehydration can also extend the duration of symptoms. Drink plenty of fluids and get plenty of rest. You should be drinking at least half a liter of water an hour to stay hydrated. Electrolyte drinks (ex. Gatorade, Powerade, Pedialyte) are also encouraged. You should be drinking enough fluids to make your urine light yellow, almost clear. If this is not the case, you are not drinking enough water. Please note that some of the treatments indicated below will not be effective if you are not adequately hydrated. Pain or fever: Ibuprofen, Naproxen, or acetaminophen (generic for Tylenol) for pain or fever.  Antiinflammatory medications: Take 600 mg of ibuprofen every 6 hours or 440 mg (over the counter dose) to 500 mg (prescription dose) of naproxen every 12 hours for the next 3 days. After this time, these medications may be used as needed for pain. Take these medications with food to avoid upset stomach. Choose only one of these medications, do not take them together. Acetaminophen (generic for Tylenol): Should you continue to have additional pain while taking the ibuprofen or naproxen, you may add in acetaminophen as needed. Your daily total maximum amount of acetaminophen from all sources should be limited to 4000mg /day for persons without liver problems, or 2000mg /day for those with liver problems. Cough: Use the  benzonatate (generic for Tessalon) for cough.  Albuterol: May use the albuterol as needed for instances of shortness of breath. Zyrtec or Claritin: May add these medication daily to control underlying symptoms of congestion, sneezing, and other signs of allergies.  These medications are available over-the-counter. Generics: Cetirizine (generic for Zyrtec) and loratadine (generic for Claritin). Fluticasone: Use fluticasone (generic for Flonase), as directed, for nasal and sinus congestion.  This medication is available over-the-counter. Congestion: Plain guaifenesin (generic for plain Mucinex) may help relieve congestion. Saline sinus rinses and saline nasal sprays may also help relieve congestion. If you do not have high blood pressure, heart problems, or an allergy to such medications, you may also try phenylephrine or Sudafed. Sore throat: Warm liquids or Chloraseptic spray may help soothe a sore throat. Gargle twice a day with a salt water solution made from a half teaspoon of salt in a cup of warm water.  Follow up: Follow up with a primary care provider within the next two weeks should symptoms fail to resolve. Return: Return to the ED for significantly worsening symptoms, shortness of breath, or any other major concerns.  For prescription assistance, may try using prescription discount sites or apps, such as goodrx.com

## 2018-08-18 ENCOUNTER — Emergency Department (HOSPITAL_BASED_OUTPATIENT_CLINIC_OR_DEPARTMENT_OTHER): Payer: Self-pay

## 2018-08-18 ENCOUNTER — Other Ambulatory Visit: Payer: Self-pay

## 2018-08-18 ENCOUNTER — Emergency Department (HOSPITAL_BASED_OUTPATIENT_CLINIC_OR_DEPARTMENT_OTHER)
Admission: EM | Admit: 2018-08-18 | Discharge: 2018-08-18 | Disposition: A | Discharge status: Home / Self Care | Payer: Self-pay | Attending: Emergency Medicine | Admitting: Emergency Medicine

## 2018-08-18 ENCOUNTER — Encounter (HOSPITAL_BASED_OUTPATIENT_CLINIC_OR_DEPARTMENT_OTHER): Payer: Self-pay | Admitting: *Deleted

## 2018-08-18 DIAGNOSIS — S93402A Sprain of unspecified ligament of left ankle, initial encounter: Secondary | ICD-10-CM | POA: Insufficient documentation

## 2018-08-18 DIAGNOSIS — Y92480 Sidewalk as the place of occurrence of the external cause: Secondary | ICD-10-CM | POA: Insufficient documentation

## 2018-08-18 DIAGNOSIS — Y999 Unspecified external cause status: Secondary | ICD-10-CM | POA: Insufficient documentation

## 2018-08-18 DIAGNOSIS — Y9389 Activity, other specified: Secondary | ICD-10-CM | POA: Insufficient documentation

## 2018-08-18 DIAGNOSIS — W1842XA Slipping, tripping and stumbling without falling due to stepping into hole or opening, initial encounter: Secondary | ICD-10-CM | POA: Insufficient documentation

## 2018-08-18 MED ORDER — IBUPROFEN 400 MG PO TABS
400.0000 mg | ORAL_TABLET | Freq: Once | ORAL | Status: AC
  Administered 2018-08-18: 400 mg via ORAL
  Filled 2018-08-18: qty 1

## 2018-08-18 NOTE — ED Provider Notes (Signed)
[facility] EMERGENCY DEPARTMENT Provider Note   CSN: [id number] Arrival date & time: 08/18/18  1707     History   Chief Complaint Chief Complaint  Patient presents with  . Ankle Injury    HPI Jordan Grimes is a 27 y.o. male.  The history is provided by the patient.  Ankle Injury  This is a new problem. The current episode started 3 to 5 hours ago. The problem occurs constantly. The problem has not changed since onset.Associated symptoms comments: Left  ankle pain and swelling.  Stepped off the side of the sidewalk and twisted it earlier today.  No upper leg pain or numbness or tingling in the foot.  No other injury from the fall.  Unable to bear weight due to pain.. The symptoms are aggravated by twisting, bending and walking. The symptoms are relieved by rest. He has tried rest for the symptoms. The treatment provided no relief.    History reviewed. No pertinent past medical history.  There are no active problems to display for this patient.   Past Surgical History:  Procedure Laterality Date  . ANKLE SURGERY    . APPENDECTOMY    . HERNIA REPAIR    . MENISCUS REPAIR          Home Medications    Prior to Admission medications   Medication Sig Start Date End Date Taking? Authorizing Provider  albuterol (PROVENTIL HFA;VENTOLIN HFA) 108 (90 Base) MCG/ACT inhaler Inhale 2 puffs into the lungs every 4 (four) hours as needed for wheezing or shortness of breath. 06/29/18  Yes Joy, Shawn C, PA-C  azithromycin (ZITHROMAX) 250 MG tablet Take 1 tablet (250 mg total) by mouth daily. Take first 2 tablets together, then 1 every day until finished. 06/29/18   Joy, Shawn C, PA-C  benzonatate (TESSALON) 100 MG capsule Take 1 capsule (100 mg total) by mouth every 8 (eight) hours. 06/29/18   Anselm PancoastJoy, Shawn C, PA-C    Family History No family history on file.  Social History Social History   Tobacco Use  . Smoking status: Never Smoker  . Smokeless tobacco: Never  Used  Substance Use Topics  . Alcohol use: Yes    Comment: occ  . Drug use: No     Allergies   Patient has no known allergies.   Review of Systems Review of Systems  All other systems reviewed and are negative.    Physical Exam Updated Vital Signs BP (!) 144/89 (BP Location: Right Arm)   Pulse 84   Temp 99.2 F (37.3 C) (Oral)   Resp 16   Ht 6\' 1"  (1.854 m)   Wt 119.7 kg   SpO2 100%   BMI 34.83 kg/m   Physical Exam Vitals signs and nursing note reviewed.  Constitutional:      Appearance: Normal appearance. He is not ill-appearing.  HENT:     Head: Normocephalic.     Mouth/Throat:     Mouth: Mucous membranes are moist.  Cardiovascular:     Rate and Rhythm: Normal rate.     Pulses: Normal pulses.  Pulmonary:     Effort: Pulmonary effort is normal.  Musculoskeletal:     Right ankle: He exhibits decreased range of motion and swelling. Tenderness. Lateral malleolus tenderness found. No medial malleolus, no head of 5th metatarsal and no proximal fibula tenderness found.     Left ankle: He exhibits normal range of motion and no swelling. No tenderness. No medial malleolus, no posterior TFL, no head  of 5th metatarsal and no proximal fibula tenderness found.  Skin:    General: Skin is warm.     Capillary Refill: Capillary refill takes less than 2 seconds.  Neurological:     General: No focal deficit present.     Mental Status: He is alert. Mental status is at baseline.  Psychiatric:        Mood and Affect: Mood normal.      ED Treatments / Results  Labs (all labs ordered are listed, but only abnormal results are displayed) Labs Reviewed - No data to display  EKG None  Radiology Dg Ankle Complete Left  Result Date: 08/18/2018 CLINICAL DATA:  Twisting injury of the left ankle today. EXAM: LEFT ANKLE COMPLETE - 3+ VIEW COMPARISON:  None. FINDINGS: There is no fracture or dislocation. There is soft tissue swelling around the ankle with an ankle effusion.  IMPRESSION: No acute osseous abnormality. Soft tissue swelling and ankle effusion. Electronically Signed   By: Francene BoyersJames  Maxwell M.D.   On: 08/18/2018 18:12    Procedures Procedures (including critical care time)  Medications Ordered in ED Medications  ibuprofen (ADVIL,MOTRIN) tablet 400 mg (400 mg Oral Given 08/18/18 1721)     Initial Impression / Assessment and Plan / ED Course  I have reviewed the triage vital signs and the nursing notes.  Pertinent labs & imaging results that were available during my care of the patient were reviewed by me and considered in my medical decision making (see chart for details).     Patient with uncomplicated ankle sprain x-rays with soft tissue swelling and effusion but no bony abnormality.  Patient placed on crutches and in an Aircast.  Given follow-up.  Final Clinical Impressions(s) / ED Diagnoses   Final diagnoses:  Sprain of left ankle, unspecified ligament, initial encounter    ED Discharge Orders    None       Gwyneth SproutPlunkett, Jaszmine Navejas, MD 08/18/18 1933

## 2018-08-18 NOTE — ED Notes (Signed)
ED Provider at bedside. 

## 2018-08-18 NOTE — ED Triage Notes (Signed)
Left ankle injury. He stepped in a hole while helping someone move.

## 2018-08-18 NOTE — ED Notes (Signed)
PMS intact before and after. Pt tolerated well. All questions answered. 

## 2018-08-18 NOTE — ED Notes (Signed)
Ice pack

## 2018-10-18 DIAGNOSIS — R110 Nausea: Principal | ICD-10-CM

## 2018-10-18 DIAGNOSIS — M791 Myalgia, unspecified site: Principal | ICD-10-CM

## 2018-10-18 DIAGNOSIS — R197 Diarrhea, unspecified: Principal | ICD-10-CM

## 2018-10-18 DIAGNOSIS — R319 Hematuria, unspecified: Principal | ICD-10-CM

## 2018-10-18 DIAGNOSIS — R05 Cough: Principal | ICD-10-CM

## 2018-10-18 DIAGNOSIS — I10 Essential (primary) hypertension: Principal | ICD-10-CM

## 2018-10-18 DIAGNOSIS — H5789 Other specified disorders of eye and adnexa: Principal | ICD-10-CM

## 2018-10-18 DIAGNOSIS — Z113 Encounter for screening for infections with a predominantly sexual mode of transmission: Principal | ICD-10-CM

## 2018-10-18 DIAGNOSIS — N390 Urinary tract infection, site not specified: Principal | ICD-10-CM

## 2018-10-18 DIAGNOSIS — R000 Tachycardia, unspecified: Principal | ICD-10-CM

## 2018-10-18 DIAGNOSIS — R509 Fever, unspecified: Principal | ICD-10-CM

## 2018-10-19 ENCOUNTER — Ambulatory Visit
Admit: 2018-10-19 | Discharge: 2018-10-19 | Disposition: A | Discharge status: Home / Self Care | Payer: BLUE CROSS/BLUE SHIELD

## 2018-10-19 MED ORDER — ONDANSETRON 4 MG DISINTEGRATING TABLET
ORAL_TABLET | Freq: Three times a day (TID) | ORAL | 0 refills | 0.00000 days | Status: CP | PRN
Start: 2018-10-19 — End: 2018-10-26

## 2018-10-19 MED ORDER — CEPHALEXIN 500 MG CAPSULE
ORAL_CAPSULE | Freq: Three times a day (TID) | ORAL | 0 refills | 0.00000 days | Status: CP
Start: 2018-10-19 — End: 2018-10-29

## 2019-10-18 ENCOUNTER — Other Ambulatory Visit: Payer: Self-pay

## 2019-10-18 ENCOUNTER — Encounter (HOSPITAL_BASED_OUTPATIENT_CLINIC_OR_DEPARTMENT_OTHER): Payer: Self-pay | Admitting: *Deleted

## 2019-10-18 ENCOUNTER — Emergency Department (HOSPITAL_BASED_OUTPATIENT_CLINIC_OR_DEPARTMENT_OTHER)
Admission: EM | Admit: 2019-10-18 | Discharge: 2019-10-18 | Disposition: A | Discharge status: Home / Self Care | Payer: Medicaid Other | Attending: Emergency Medicine | Admitting: Emergency Medicine

## 2019-10-18 DIAGNOSIS — I10 Essential (primary) hypertension: Secondary | ICD-10-CM | POA: Insufficient documentation

## 2019-10-18 DIAGNOSIS — F17210 Nicotine dependence, cigarettes, uncomplicated: Secondary | ICD-10-CM | POA: Insufficient documentation

## 2019-10-18 DIAGNOSIS — J029 Acute pharyngitis, unspecified: Secondary | ICD-10-CM | POA: Insufficient documentation

## 2019-10-18 DIAGNOSIS — Z79899 Other long term (current) drug therapy: Secondary | ICD-10-CM | POA: Insufficient documentation

## 2019-10-18 HISTORY — DX: Essential (primary) hypertension: I10

## 2019-10-18 LAB — GROUP A STREP BY PCR: Group A Strep by PCR: NOT DETECTED

## 2019-10-18 MED ORDER — NAPROXEN 500 MG PO TABS: 500.0000 mg | ORAL_TABLET | Freq: Two times a day (BID) | ORAL | 0 refills | Status: AC

## 2019-10-18 MED ORDER — DEXAMETHASONE SODIUM PHOSPHATE 10 MG/ML IJ SOLN
10.0000 mg | Freq: Once | INTRAMUSCULAR | Status: AC
  Administered 2019-10-18: 04:00:00 10 mg via INTRAVENOUS
  Filled 2019-10-18: qty 1

## 2019-10-18 MED ORDER — HYDROCODONE-ACETAMINOPHEN 7.5-325 MG/15ML PO SOLN
10.0000 mL | Freq: Once | ORAL | Status: AC
  Administered 2019-10-18: 04:00:00 10 mL via ORAL
  Filled 2019-10-18: qty 15

## 2019-10-18 NOTE — Discharge Instructions (Addendum)
You were seen today for sore throat.  Your strep testing is negative.  Your tonsils are enlarged but do not have any drainage.  This is likely contributing.  You were given a dose of Decadron.  Take naproxen twice a day for pain.  If you develop fevers, worsening sore throat, inability to swallow you should be reevaluated.

## 2019-10-18 NOTE — ED Provider Notes (Addendum)
[facility]ERGENC[facility] DEPARTMENT Provider Note   CSN: [id number] Arrival date & time: 10/18/19  0334     History Chief Complaint  Patient presents with  . Sore Throat    Jordan Grimes is a 29 y.o. male.  HPI     This is a 29 year old male with a history of hypertension who presents with sore throat.  Patient reports onset of sore throat on Friday.  He has not noted any fevers.  He reports pain with swallowing but no difficulty swallowing.  He rates his pain at 9 out of 10.  He has taken ibuprofen, honey, and allergy medications with no relief.  He denies any upper respiratory symptoms including cough, congestion.  Does state he has a history of allergies and knows "pollen is out."  Denies any Covid exposures.  Past Medical History:  Diagnosis Date  . Hypertension     There are no problems to display for this patient.   Past Surgical History:  Procedure Laterality Date  . ANKLE SURGERY    . APPENDECTOMY    . HERNIA REPAIR    . MENISCUS REPAIR         No family history on file.  Social History   Tobacco Use  . Smoking status: Current Some Day Smoker    Types: Cigarettes  . Smokeless tobacco: Never Used  Substance Use Topics  . Alcohol use: Yes    Comment: occ  . Drug use: Yes    Types: Marijuana    Home Medications Prior to Admission medications   Medication Sig Start Date End Date Taking? Authorizing Provider  Bictegravir-Emtricitab-Tenofov (BIKTARVY PO) Take by mouth.   Yes [provider]  losartan (COZAAR) 25 MG tablet Take 25 mg by mouth daily.   Yes [provider]  albuterol (PROVENTIL HFA;VENTOLIN HFA) 108 (90 Base) MCG/ACT inhaler Inhale 2 puffs into the lungs every 4 (four) hours as needed for wheezing or shortness of breath. 06/29/18   Joy, Shawn C, PA-C  azithromycin (ZITHROMAX) 250 MG tablet Take 1 tablet (250 mg total) by mouth daily. Take first 2 tablets together, then 1 every day until finished. 06/29/18    Joy, Shawn C, PA-C  benzonatate (TESSALON) 100 MG capsule Take 1 capsule (100 mg total) by mouth every 8 (eight) hours. 06/29/18   Joy, Shawn C, PA-C  naproxen (NAPROSYN) 500 MG tablet Take 1 tablet (500 mg total) by mouth 2 (two) times daily. 10/18/19   Miriya Cloer, Barbette Hair, MD    Allergies    Patient has no known allergies.  Review of Systems   Review of Systems  Constitutional: Negative for fever.  HENT: Positive for sore throat. Negative for congestion, sneezing and trouble swallowing.   Respiratory: Negative for cough and shortness of breath.   Cardiovascular: Negative for chest pain.  Gastrointestinal: Negative for abdominal pain.  All other systems reviewed and are negative.   Physical Exam Updated Vital Signs BP 126/89 (BP Location: Right Arm)   Pulse 82   Temp 98.6 F (37 C) (Oral)   Resp 18   Ht 1.854 m (6\' 1" )   Wt 124.3 kg   SpO2 100%   BMI 36.15 kg/m   Physical Exam Vitals and nursing note reviewed.  Constitutional:      Appearance: He is well-developed. He is not ill-appearing.     Comments: ABCs intact  HENT:     Head: Normocephalic and atraumatic.     Nose: No congestion.  Mouth/Throat:     Mouth: Mucous membranes are moist.     Tonsils: 2+ on the right. 2+ on the left.     Comments: 2+ symmetric tonsillar swelling, no exudate noted Eyes:     Pupils: Pupils are equal, round, and reactive to light.  Cardiovascular:     Rate and Rhythm: Normal rate and regular rhythm.     Heart sounds: Normal heart sounds. No murmur.  Pulmonary:     Effort: Pulmonary effort is normal. No respiratory distress.     Breath sounds: Normal breath sounds. No wheezing.  Abdominal:     General: Bowel sounds are normal.     Palpations: Abdomen is soft.     Tenderness: There is no abdominal tenderness. There is no rebound.  Musculoskeletal:     Cervical back: Neck supple.  Lymphadenopathy:     Cervical: No cervical adenopathy.  Skin:    General: Skin is warm and dry.    Neurological:     Mental Status: He is alert and oriented to person, place, and time.  Psychiatric:        Mood and Affect: Mood normal.     ED Results / Procedures / Treatments   Labs (all labs ordered are listed, but only abnormal results are displayed) Labs Reviewed  GROUP A STREP BY PCR    EKG None  Radiology No results found.  Procedures Procedures (including critical care time)  Medications Ordered in ED Medications  dexamethasone (DECADRON) injection 10 mg (10 mg Intravenous Given 10/18/19 0418)  HYDROcodone-acetaminophen (HYCET) 7.5-325 mg/15 ml solution 10 mL (10 mLs Oral Given 10/18/19 0416)    ED Course  I have reviewed the triage vital signs and the nursing notes.  Pertinent labs & imaging results that were available during my care of the patient were reviewed by me and considered in my medical decision making (see chart for details).    MDM Rules/Calculators/A&P                       Patient presents with isolated sore throat.  No significant associated symptoms including fever cough.  Vital signs are reassuring.  He is afebrile and nontoxic-appearing.  No exudate but fairly significant tonsillar swelling.  Patient was given dose of Decadron for his swelling.  Strep screen is negative.  Suspect viral versus allergic etiology given how well-appearing he has.  No physical exam findings suggestive of peritonsillar abscess.  Recommend supportive measures including warm salt gargles and naproxen for pain.  After history, exam, and medical workup I feel the patient has been appropriately medically screened and is safe for discharge home. Pertinent diagnoses were discussed with the patient. Patient was given return precautions.   Final Clinical Impression(s) / ED Diagnoses Final diagnoses:  Viral pharyngitis    Rx / DC Orders ED Discharge Orders         Ordered    naproxen (NAPROSYN) 500 MG tablet  2 times daily     10/18/19 0432           Jordan Grimes,  Mayer Masker, MD 10/18/19 8338    Shon Baton, MD 10/18/19 [phone]

## 2019-10-18 NOTE — ED Notes (Signed)
MD with pt  

## 2019-10-18 NOTE — ED Triage Notes (Addendum)
C/o sore throat that started on Friday. States worse this morning. Denies any fevers. C/o diarrhea. Denies abd pain. Denies any cough and congestion. Has taken ibuprofen, honey, and allergy meds without relief. On exam, tonsils are swollen bilateral and almost touching. Pt is able to maintain secretions. Does state at times he "feels like he is choking" and this is what made him decide to be evaluated.

## 2019-12-06 ENCOUNTER — Emergency Department (HOSPITAL_BASED_OUTPATIENT_CLINIC_OR_DEPARTMENT_OTHER)
Admission: EM | Admit: 2019-12-06 | Discharge: 2019-12-06 | Disposition: A | Discharge status: Home / Self Care | Payer: Medicaid Other | Attending: Emergency Medicine | Admitting: Emergency Medicine

## 2019-12-06 ENCOUNTER — Encounter (HOSPITAL_BASED_OUTPATIENT_CLINIC_OR_DEPARTMENT_OTHER): Payer: Self-pay | Admitting: Emergency Medicine

## 2019-12-06 ENCOUNTER — Other Ambulatory Visit: Payer: Self-pay

## 2019-12-06 DIAGNOSIS — I10 Essential (primary) hypertension: Secondary | ICD-10-CM | POA: Insufficient documentation

## 2019-12-06 DIAGNOSIS — Z79899 Other long term (current) drug therapy: Secondary | ICD-10-CM | POA: Insufficient documentation

## 2019-12-06 DIAGNOSIS — F17210 Nicotine dependence, cigarettes, uncomplicated: Secondary | ICD-10-CM | POA: Insufficient documentation

## 2019-12-06 DIAGNOSIS — R05 Cough: Secondary | ICD-10-CM | POA: Insufficient documentation

## 2019-12-06 DIAGNOSIS — R059 Cough, unspecified: Secondary | ICD-10-CM

## 2019-12-06 DIAGNOSIS — B20 Human immunodeficiency virus [HIV] disease: Secondary | ICD-10-CM | POA: Insufficient documentation

## 2019-12-06 MED ORDER — BENZONATATE 100 MG PO CAPS: 100.0000 mg | ORAL_CAPSULE | Freq: Three times a day (TID) | ORAL | 0 refills | Status: DC

## 2019-12-06 MED ORDER — PREDNISONE 10 MG (21) PO TBPK: ORAL_TABLET | ORAL | 0 refills | Status: DC

## 2019-12-06 NOTE — ED Triage Notes (Signed)
Cough and SOB x3days.

## 2019-12-06 NOTE — ED Provider Notes (Signed)
[facility] Provider Note   CSN: [id number] Arrival date & time: 12/06/19  1624     History Chief Complaint  Patient presents with  . Cough    Jordan Grimes is a 29 y.o. male.  HPI      Jordan Grimes is a 30 y.o. male, with a history of HIV and HTN, presenting to the ED with cough for the last 3 days with clear sputum.  Accompanied by nasal congestion and rhinorrhea.  Intermittent shortness of breath.  States his HIV lab work was last checked last month.  Viral load undetectable.  Unsure of CD4 count, but states it was normal.  I do not have these values in our system to verify.  Denies fever/chills, dizziness, syncope, chest pain, abdominal pain, N/V/D, lower extremity edema/pain, or any other complaints.   Past Medical History:  Diagnosis Date  . Hypertension     There are no problems to display for this patient.   Past Surgical History:  Procedure Laterality Date  . ANKLE SURGERY    . APPENDECTOMY    . HERNIA REPAIR    . MENISCUS REPAIR         No family history on file.  Social History   Tobacco Use  . Smoking status: Current Some Day Smoker    Types: Cigarettes  . Smokeless tobacco: Never Used  Substance Use Topics  . Alcohol use: Yes    Comment: occ  . Drug use: Yes    Types: Marijuana    Home Medications Prior to Admission medications   Medication Sig Start Date End Date Taking? Authorizing Provider  albuterol (PROVENTIL HFA;VENTOLIN HFA) 108 (90 Base) MCG/ACT inhaler Inhale 2 puffs into the lungs every 4 (four) hours as needed for wheezing or shortness of breath. 06/29/18   Doyce Saling C, PA-C  benzonatate (TESSALON) 100 MG capsule Take 1 capsule (100 mg total) by mouth every 8 (eight) hours. 12/06/19   [place] Fassnacht C, PA-C  Bictegravir-Emtricitab-Tenofov (BIKTARVY PO) Take by mouth.    [provider]  losartan (COZAAR) 25 MG tablet Take 25 mg by mouth daily.    [provider]    naproxen (NAPROSYN) 500 MG tablet Take 1 tablet (500 mg total) by mouth 2 (two) times daily. 10/18/19   Horton, Barbette Hair, MD  predniSONE (STERAPRED UNI-PAK 21 TAB) 10 MG (21) TBPK tablet Take 6 tabs (60mg ) day 1, 5 tabs (50mg ) day 2, 4 tabs (40mg ) day 3, 3 tabs (30mg ) day 4, 2 tabs (20mg ) day 5, and 1 tab (10mg ) day 6. 12/06/19   Vera Furniss C, PA-C    Allergies    Patient has no known allergies.  Review of Systems   Review of Systems  Constitutional: Negative for chills, diaphoresis and fever.  Respiratory: Positive for cough and shortness of breath.   Cardiovascular: Negative for chest pain and leg swelling.  Gastrointestinal: Negative for abdominal pain, diarrhea, nausea and vomiting.  Musculoskeletal: Negative for back pain.  Neurological: Negative for dizziness, syncope and weakness.  All other systems reviewed and are negative.   Physical Exam Updated Vital Signs BP (!) 160/102 (BP Location: Left Arm)   Pulse 96   Temp 98.9 F (37.2 C) (Oral)   Resp 18   Ht 6\' 1"  (1.854 m)   Wt 122 kg   SpO2 98%   BMI 35.49 kg/m   Physical Exam Vitals and nursing note reviewed.  Constitutional:      General: He is  not in acute distress.    Appearance: He is well-developed. He is not diaphoretic.  HENT:     Head: Normocephalic and atraumatic.     Mouth/Throat:     Mouth: Mucous membranes are moist.     Pharynx: Oropharynx is clear.  Eyes:     Conjunctiva/sclera: Conjunctivae normal.  Cardiovascular:     Rate and Rhythm: Normal rate and regular rhythm.     Pulses: Normal pulses.          Radial pulses are 2+ on the right side and 2+ on the left side.       Posterior tibial pulses are 2+ on the right side and 2+ on the left side.     Heart sounds: Normal heart sounds.     Comments: Tactile temperature in the extremities appropriate and equal bilaterally. Pulmonary:     Effort: Pulmonary effort is normal. No respiratory distress.     Breath sounds: Normal breath sounds.      Comments: No increased work of breathing.  Speaks in full sentences without difficulty. Abdominal:     Palpations: Abdomen is soft.     Tenderness: There is no abdominal tenderness. There is no guarding.  Musculoskeletal:     Cervical back: Neck supple.     Right lower leg: No edema.     Left lower leg: No edema.     Comments: No lower extremity edema, color change, temperature abnormality, tenderness.  Lymphadenopathy:     Cervical: No cervical adenopathy.  Skin:    General: Skin is warm and dry.  Neurological:     Mental Status: He is alert.  Psychiatric:        Mood and Affect: Mood and affect normal.        Speech: Speech normal.        Behavior: Behavior normal.     ED Results / Procedures / Treatments   Labs (all labs ordered are listed, but only abnormal results are displayed) Labs Reviewed - No data to display  EKG None  Radiology No results found.  Procedures Procedures (including critical care time)  Medications Ordered in ED Medications - No data to display  ED Course  I have reviewed the triage vital signs and the nursing notes.  Pertinent labs & imaging results that were available during my care of the patient were reviewed by me and considered in my medical decision making (see chart for details).    MDM Rules/Calculators/A&P                      Patient presents with cough, nasal congestion, rhinorrhea, and intermittent shortness of breath. Patient is nontoxic appearing, afebrile, not tachycardic, not tachypneic, not hypotensive, maintains excellent SPO2 on room air, and is in no apparent distress.   I have reviewed the patient's chart to obtain more information.  According to the patient, his HIV is well controlled. Discussed and offered chest x-ray and Covid testing with explanation for both, patient declined.  The patient was given instructions for home care as well as return precautions. Patient voices understanding of these instructions,  accepts the plan, and is comfortable with discharge.   Vitals:   12/06/19 1632 12/06/19 1633 12/06/19 1800  BP: (!) 160/102  135/84  Pulse: 96  88  Resp: 18  14  Temp: 98.9 F (37.2 C)    TempSrc: Oral    SpO2: 98%  100%  Weight:  122 kg   Height:  6'  1" (1.854 m)      Final Clinical Impression(s) / ED Diagnoses Final diagnoses:  Cough    Rx / DC Orders ED Discharge Orders         Ordered    benzonatate (TESSALON) 100 MG capsule  Every 8 hours     12/06/19 1750    predniSONE (STERAPRED UNI-PAK 21 TAB) 10 MG (21) TBPK tablet     12/06/19 1750           [place] Pelto, Hillard Danker, PA-C 12/08/19 0022    Melene Plan, DO 12/10/19 1500

## 2020-01-03 IMAGING — CR DG CHEST 2V
2 series · 2 of 2 positions shown · non-contrast
Comparison: June 12, 2017

CLINICAL DATA: Chest pain and fever

EXAM:
CHEST - 2 VIEW

[w chest pa *]
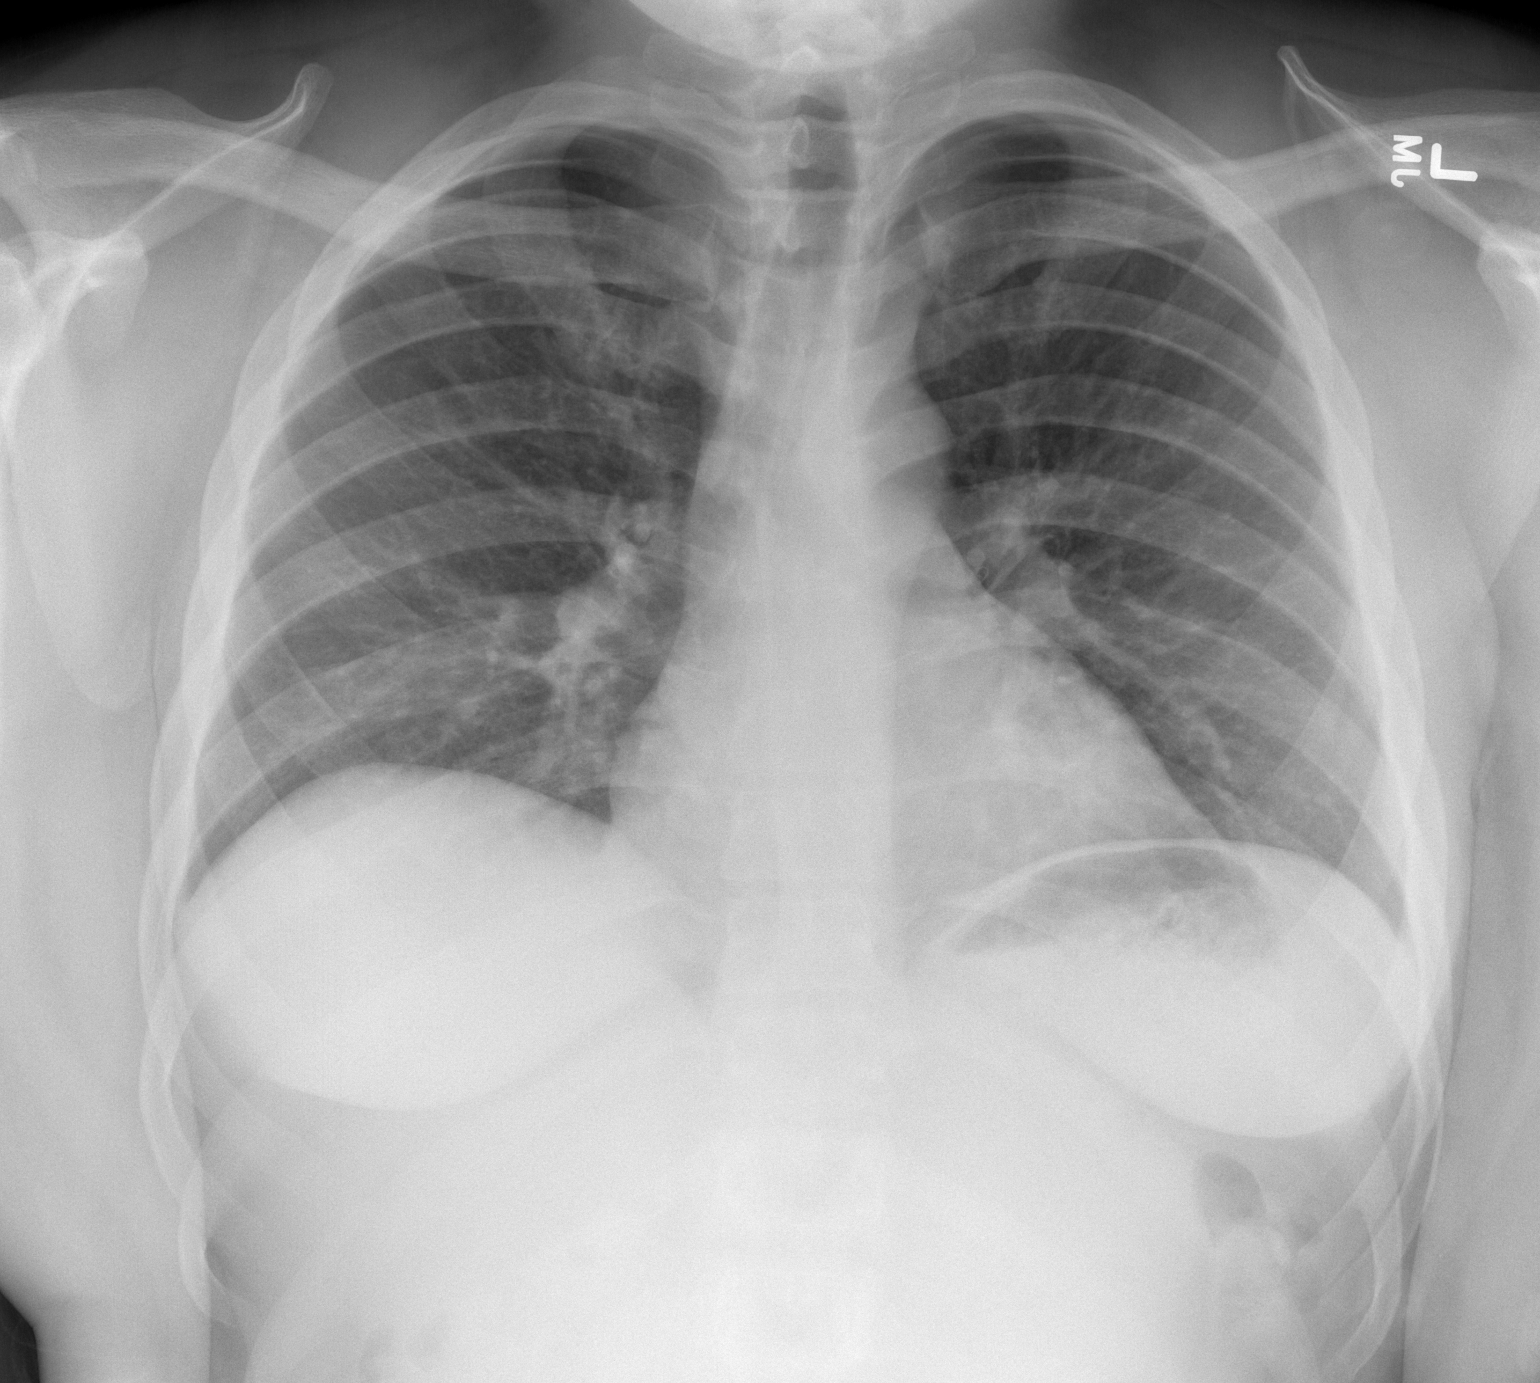

[w chest lat *]
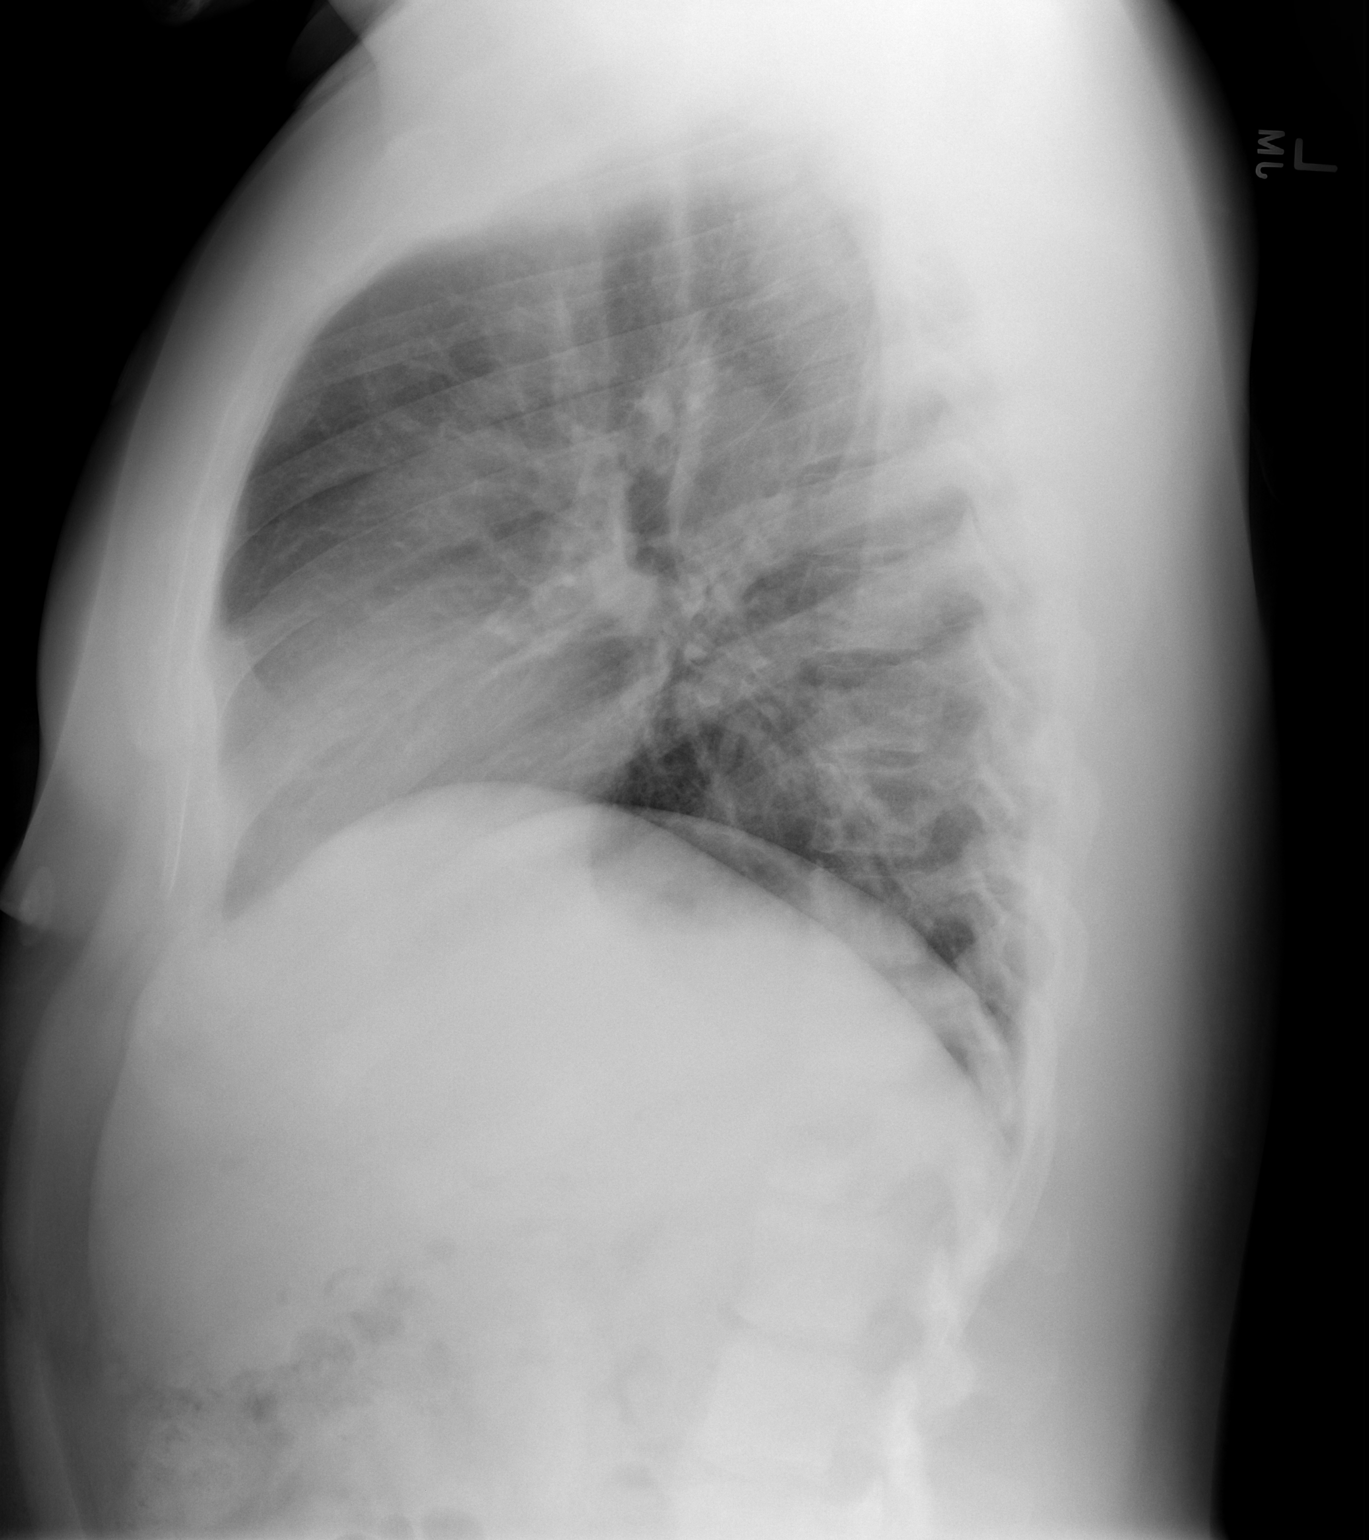

[2 of 2 positions shown; findings below may reference images not displayed]

FINDINGS: There is subtle increased opacity in the right base, concerning for
a focal area of pneumonia. Lungs elsewhere are clear. Heart size and
pulmonary vascularity are normal. No adenopathy. No bone lesions.
IMPRESSION: Subtle infiltrate right base, felt to represent pneumonia. Lungs
elsewhere clear. Cardiac silhouette normal.

## 2020-04-06 ENCOUNTER — Ambulatory Visit
Admit: 2020-04-06 | Discharge: 2020-04-06 | Payer: BLUE CROSS/BLUE SHIELD | Attending: Internal Medicine | Primary: Internal Medicine

## 2020-04-06 ENCOUNTER — Ambulatory Visit: Admit: 2020-04-06 | Discharge: 2020-04-06 | Payer: BLUE CROSS/BLUE SHIELD | Attending: Clinical | Primary: Clinical

## 2020-04-06 DIAGNOSIS — B20 Human immunodeficiency virus [HIV] disease: Principal | ICD-10-CM

## 2020-04-06 MED ORDER — BIKTARVY 50 MG-200 MG-25 MG TABLET
ORAL_TABLET | Freq: Every day | ORAL | 3 refills | 0.00000 days | Status: CP
Start: 2020-04-06 — End: 2020-05-06

## 2020-04-12 ENCOUNTER — Encounter: Admit: 2020-04-12 | Discharge: 2020-04-13 | Payer: BLUE CROSS/BLUE SHIELD | Attending: Clinical | Primary: Clinical

## 2020-04-14 ENCOUNTER — Non-Acute Institutional Stay: Admit: 2020-04-14 | Discharge: 2020-04-15 | Payer: BLUE CROSS/BLUE SHIELD

## 2020-04-28 ENCOUNTER — Ambulatory Visit: Admit: 2020-04-28 | Discharge: 2020-04-29 | Payer: MEDICAID

## 2020-04-28 DIAGNOSIS — N4889 Other specified disorders of penis: Principal | ICD-10-CM

## 2020-04-28 DIAGNOSIS — J351 Hypertrophy of tonsils: Principal | ICD-10-CM

## 2020-04-28 DIAGNOSIS — B20 Human immunodeficiency virus [HIV] disease: Principal | ICD-10-CM

## 2020-04-28 DIAGNOSIS — I10 Essential (primary) hypertension: Principal | ICD-10-CM

## 2020-04-28 DIAGNOSIS — Z113 Encounter for screening for infections with a predominantly sexual mode of transmission: Principal | ICD-10-CM

## 2020-04-28 DIAGNOSIS — F329 Major depressive disorder, single episode, unspecified: Principal | ICD-10-CM

## 2020-05-03 ENCOUNTER — Ambulatory Visit: Admit: 2020-05-03 | Payer: BLUE CROSS/BLUE SHIELD | Attending: Clinical | Primary: Clinical

## 2020-06-06 ENCOUNTER — Emergency Department (HOSPITAL_BASED_OUTPATIENT_CLINIC_OR_DEPARTMENT_OTHER)
Admission: EM | Admit: 2020-06-06 | Discharge: 2020-06-07 | Disposition: A | Discharge status: Home / Self Care | Payer: Self-pay | Attending: Emergency Medicine | Admitting: Emergency Medicine

## 2020-06-06 ENCOUNTER — Encounter (HOSPITAL_BASED_OUTPATIENT_CLINIC_OR_DEPARTMENT_OTHER): Payer: Self-pay | Admitting: *Deleted

## 2020-06-06 ENCOUNTER — Other Ambulatory Visit: Payer: Self-pay

## 2020-06-06 DIAGNOSIS — E669 Obesity, unspecified: Secondary | ICD-10-CM | POA: Insufficient documentation

## 2020-06-06 DIAGNOSIS — F17210 Nicotine dependence, cigarettes, uncomplicated: Secondary | ICD-10-CM | POA: Insufficient documentation

## 2020-06-06 DIAGNOSIS — Z79899 Other long term (current) drug therapy: Secondary | ICD-10-CM | POA: Insufficient documentation

## 2020-06-06 DIAGNOSIS — R0789 Other chest pain: Secondary | ICD-10-CM | POA: Insufficient documentation

## 2020-06-06 DIAGNOSIS — B349 Viral infection, unspecified: Secondary | ICD-10-CM | POA: Insufficient documentation

## 2020-06-06 DIAGNOSIS — I10 Essential (primary) hypertension: Secondary | ICD-10-CM | POA: Insufficient documentation

## 2020-06-06 DIAGNOSIS — Z20822 Contact with and (suspected) exposure to covid-19: Secondary | ICD-10-CM | POA: Insufficient documentation

## 2020-06-06 DIAGNOSIS — R000 Tachycardia, unspecified: Secondary | ICD-10-CM | POA: Insufficient documentation

## 2020-06-06 LAB — RESPIRATORY PANEL BY RT PCR (FLU A&B, COVID)
Influenza A by PCR: NEGATIVE
Influenza B by PCR: NEGATIVE
SARS Coronavirus 2 by RT PCR: NEGATIVE

## 2020-06-06 MED ORDER — ACETAMINOPHEN 325 MG PO TABS
650.0000 mg | ORAL_TABLET | Freq: Once | ORAL | Status: AC
  Administered 2020-06-06: 650 mg via ORAL
  Filled 2020-06-06: qty 2

## 2020-06-06 MED ORDER — KETOROLAC TROMETHAMINE 15 MG/ML IJ SOLN
30.0000 mg | Freq: Once | INTRAMUSCULAR | Status: AC
  Administered 2020-06-07: 30 mg via INTRAMUSCULAR
  Filled 2020-06-06: qty 2

## 2020-06-06 NOTE — ED Triage Notes (Signed)
Sharp chest pain and headache since 11am.

## 2020-06-06 NOTE — ED Notes (Signed)
Pt. Reports he woke today at 11am feeling like he had body aches and fever.  Pt. Reports cough dry.  Pt. Reports he has to see a specialist for tonsils on Nov. 1st

## 2020-06-07 ENCOUNTER — Emergency Department (HOSPITAL_BASED_OUTPATIENT_CLINIC_OR_DEPARTMENT_OTHER): Payer: Self-pay

## 2020-06-07 NOTE — Discharge Instructions (Addendum)
You were seen today for chest discomfort and headache.  You were found to have a fever.  You likely have a viral syndrome.  Your Covid testing is negative.  Take naproxen or Tylenol as needed for pain or fevers.  Make sure to stay hydrated.  I encourage you to get your Covid vaccine.

## 2020-06-07 NOTE — ED Provider Notes (Signed)
[facility]PART[facility] Provider Note   CSN: [id number] Arrival date & time: 06/06/20  2004     History Chief Complaint  Patient presents with  . Chest Pain  . Fever    Jordan Grimes is a 29 y.o. male.  HPI     This is a 29 year old male with a history of hypertension who presents with body aches, chest pain, and headache.  Patient reports onset of symptoms earlier yesterday.  He reports sharp right-sided chest pain and shortness of breath.  He has had a nonproductive cough.  No congestion or sore throat.  He states that he has had body ache and a right temporal headache.  He rates his pain at 10 out of 10.  He took Tylenol with minimal relief.  No known sick contacts or Covid exposures.  He is not vaccinated against COVID-19.  Past Medical History:  Diagnosis Date  . Hypertension     There are no problems to display for this patient.   Past Surgical History:  Procedure Laterality Date  . ANKLE SURGERY    . APPENDECTOMY    . HERNIA REPAIR    . MENISCUS REPAIR         No family history on file.  Social History   Tobacco Use  . Smoking status: Current Some Day Smoker    Types: Cigarettes  . Smokeless tobacco: Never Used  Vaping Use  . Vaping Use: Never used  Substance Use Topics  . Alcohol use: Yes    Comment: occ  . Drug use: Yes    Types: Marijuana    Home Medications Prior to Admission medications   Medication Sig Start Date End Date Taking? Authorizing Provider  albuterol (PROVENTIL HFA;VENTOLIN HFA) 108 (90 Base) MCG/ACT inhaler Inhale 2 puffs into the lungs every 4 (four) hours as needed for wheezing or shortness of breath. 06/29/18   Joy, Shawn C, PA-C  benzonatate (TESSALON) 100 MG capsule Take 1 capsule (100 mg total) by mouth every 8 (eight) hours. 12/06/19   Joy, Shawn C, PA-C  Bictegravir-Emtricitab-Tenofov (BIKTARVY PO) Take by mouth.    [provider]  losartan (COZAAR) 25 MG tablet Take 25 mg by mouth  daily.    [provider]  naproxen (NAPROSYN) 500 MG tablet Take 1 tablet (500 mg total) by mouth 2 (two) times daily. 10/18/19   Tanice Petre, Mayer Masker, MD  predniSONE (STERAPRED UNI-PAK 21 TAB) 10 MG (21) TBPK tablet Take 6 tabs (60mg ) day 1, 5 tabs (50mg ) day 2, 4 tabs (40mg ) day 3, 3 tabs (30mg ) day 4, 2 tabs (20mg ) day 5, and 1 tab (10mg ) day 6. 12/06/19   Joy, Shawn C, PA-C    Allergies    Patient has no known allergies.  Review of Systems   Review of Systems  Constitutional: Positive for chills and fever.  HENT: Negative for sore throat.   Respiratory: Positive for cough.   Cardiovascular: Positive for chest pain.  Gastrointestinal: Negative for abdominal pain, nausea and vomiting.  Genitourinary: Negative for dysuria.  All other systems reviewed and are negative.   Physical Exam Updated Vital Signs BP 130/81   Pulse 99   Temp (!) 100.7 F (38.2 C) (Oral)   Resp 18   Ht 1.854 m (6\' 1" )   Wt 125 kg   SpO2 99%   BMI 36.36 kg/m   Physical Exam Vitals and nursing note reviewed.  Constitutional:      Appearance: He is well-developed. He is  obese. He is not ill-appearing.  HENT:     Head: Normocephalic and atraumatic.     Nose: Nose normal.     Mouth/Throat:     Mouth: Mucous membranes are moist.     Comments: Bilateral tonsillar enlargement Eyes:     Pupils: Pupils are equal, round, and reactive to light.  Cardiovascular:     Rate and Rhythm: Regular rhythm. Tachycardia present.     Heart sounds: Normal heart sounds. No murmur heard.   Pulmonary:     Effort: Pulmonary effort is normal. No respiratory distress.     Breath sounds: Normal breath sounds. No wheezing.  Abdominal:     General: Bowel sounds are normal.     Palpations: Abdomen is soft.     Tenderness: There is no abdominal tenderness. There is no rebound.  Musculoskeletal:     Cervical back: Neck supple.     Right lower leg: No edema.     Left lower leg: No edema.  Lymphadenopathy:      Cervical: No cervical adenopathy.  Skin:    General: Skin is warm and dry.  Neurological:     Mental Status: He is alert and oriented to person, place, and time.  Psychiatric:        Mood and Affect: Mood normal.     ED Results / Procedures / Treatments   Labs (all labs ordered are listed, but only abnormal results are displayed) Labs Reviewed  RESPIRATORY PANEL BY RT PCR (FLU A&B, COVID)    EKG EKG Interpretation  Date/Time:  Tuesday June 06 2020 20:21:01 EDT Ventricular Rate:  106 PR Interval:  178 QRS Duration: 94 QT Interval:  334 QTC Calculation: 443 R Axis:   -77 Text Interpretation: Sinus tachycardia Pulmonary disease pattern Left anterior fascicular block Abnormal ECG Confirmed by Ross Marcus ([id number]) on 06/06/2020 11:45:42 PM   Radiology DG Chest 2 View  Result Date: 06/07/2020 CLINICAL DATA:  Sharp chest pain and headache since 11 a.m. History of hypertension. EXAM: CHEST - 2 VIEW COMPARISON:  None. FINDINGS: Shallow inspiration. Heart size and pulmonary vascularity are normal for technique. No airspace disease or consolidation in the lungs. No pleural effusions. No pneumothorax. Mediastinal contours appear intact. IMPRESSION: No active cardiopulmonary disease. Electronically Signed   By: Burman Nieves M.D.   On: 06/07/2020 00:16    Procedures Procedures (including critical care time)  Medications Ordered in ED Medications  acetaminophen (TYLENOL) tablet 650 mg (650 mg Oral Given 06/06/20 2030)  ketorolac (TORADOL) 15 MG/ML injection 30 mg (30 mg Intramuscular Given 06/07/20 0041)    ED Course  I have reviewed the triage vital signs and the nursing notes.  Pertinent labs & imaging results that were available during my care of the patient were reviewed by me and considered in my medical decision making (see chart for details).    MDM Rules/Calculators/A&P                           Patient presents with body aches, headache, chest discomfort,  cough.  Overall nontoxic-appearing.  Noted to be febrile upon arrival.  Respiratory viral panel, chest x-ray, EKG obtained.  Patient was given Tylenol and Toradol.  EKG with no acute ischemic or arrhythmic changes.  Chest x-ray without obvious pneumonia or infectious etiology.  Covid and flu testing are negative.  Suspect viral etiology.  On recheck, patient states he feels much better.  Recommend ongoing supportive measures at home.  After history, exam, and medical workup I feel the patient has been appropriately medically screened and is safe for discharge home. Pertinent diagnoses were discussed with the patient. Patient was given return precautions.   Final Clinical Impression(s) / ED Diagnoses Final diagnoses:  Viral syndrome  Atypical chest pain    Rx / DC Orders ED Discharge Orders    None       Shon Baton, MD 06/07/20 [phone]

## 2020-06-08 ENCOUNTER — Other Ambulatory Visit: Payer: Self-pay

## 2020-06-08 ENCOUNTER — Emergency Department (HOSPITAL_BASED_OUTPATIENT_CLINIC_OR_DEPARTMENT_OTHER)
Admission: EM | Admit: 2020-06-08 | Discharge: 2020-06-08 | Disposition: A | Discharge status: Home / Self Care | Payer: Medicaid Other | Attending: Emergency Medicine | Admitting: Emergency Medicine

## 2020-06-08 ENCOUNTER — Emergency Department (HOSPITAL_BASED_OUTPATIENT_CLINIC_OR_DEPARTMENT_OTHER): Payer: Medicaid Other

## 2020-06-08 ENCOUNTER — Encounter (HOSPITAL_BASED_OUTPATIENT_CLINIC_OR_DEPARTMENT_OTHER): Payer: Self-pay

## 2020-06-08 DIAGNOSIS — I10 Essential (primary) hypertension: Secondary | ICD-10-CM | POA: Insufficient documentation

## 2020-06-08 DIAGNOSIS — Z202 Contact with and (suspected) exposure to infections with a predominantly sexual mode of transmission: Secondary | ICD-10-CM

## 2020-06-08 DIAGNOSIS — R519 Headache, unspecified: Secondary | ICD-10-CM | POA: Insufficient documentation

## 2020-06-08 DIAGNOSIS — N3001 Acute cystitis with hematuria: Secondary | ICD-10-CM | POA: Insufficient documentation

## 2020-06-08 DIAGNOSIS — R079 Chest pain, unspecified: Secondary | ICD-10-CM | POA: Insufficient documentation

## 2020-06-08 DIAGNOSIS — Z79899 Other long term (current) drug therapy: Secondary | ICD-10-CM | POA: Insufficient documentation

## 2020-06-08 LAB — CBC WITH DIFFERENTIAL/PLATELET
Abs Immature Granulocytes: 0.02 10*3/uL (ref 0.00–0.07)
Basophils Absolute: 0.1 10*3/uL (ref 0.0–0.1)
Basophils Relative: 1 %
Eosinophils Absolute: 0.1 10*3/uL (ref 0.0–0.5)
Eosinophils Relative: 1 %
HCT: 48.1 % (ref 39.0–52.0)
Hemoglobin: 15.7 g/dL (ref 13.0–17.0)
Immature Granulocytes: 0 %
Lymphocytes Relative: 41 %
Lymphs Abs: 3.8 10*3/uL (ref 0.7–4.0)
MCH: 28.5 pg (ref 26.0–34.0)
MCHC: 32.6 g/dL (ref 30.0–36.0)
MCV: 87.5 fL (ref 80.0–100.0)
Monocytes Absolute: 1.1 10*3/uL — ABNORMAL HIGH (ref 0.1–1.0)
Monocytes Relative: 12 %
Neutro Abs: 4 10*3/uL (ref 1.7–7.7)
Neutrophils Relative %: 45 %
Platelets: 251 10*3/uL (ref 150–400)
RBC: 5.5 MIL/uL (ref 4.22–5.81)
RDW: 14.5 % (ref 11.5–15.5)
WBC: 9.2 10*3/uL (ref 4.0–10.5)
nRBC: 0 % (ref 0.0–0.2)

## 2020-06-08 LAB — URINALYSIS, MICROSCOPIC (REFLEX): WBC, UA: >50 WBC/hpf (ref 0–5)

## 2020-06-08 LAB — BASIC METABOLIC PANEL
Anion gap: 9 (ref 5–15)
BUN: 14 mg/dL (ref 6–20)
CO2: 26 mmol/L (ref 22–32)
Calcium: 9.1 mg/dL (ref 8.9–10.3)
Chloride: 102 mmol/L (ref 98–111)
Creatinine, Ser: 1.03 mg/dL (ref 0.61–1.24)
GFR, Estimated: >60 mL/min (ref >60)
Glucose, Bld: 98 mg/dL (ref 70–99)
Potassium: 3.7 mmol/L (ref 3.5–5.1)
Sodium: 137 mmol/L (ref 135–145)

## 2020-06-08 LAB — URINALYSIS, ROUTINE W REFLEX MICROSCOPIC
Bilirubin Urine: NEGATIVE
Glucose, UA: NEGATIVE mg/dL
Ketones, ur: 15 mg/dL — AB
Nitrite: NEGATIVE
Protein, ur: 30 mg/dL — AB
Specific Gravity, Urine: 1.025 (ref 1.005–1.030)
pH: 6 (ref 5.0–8.0)

## 2020-06-08 MED ORDER — CEFTRIAXONE SODIUM 500 MG IJ SOLR
500.0000 mg | Freq: Once | INTRAMUSCULAR | Status: AC
  Administered 2020-06-08: 500 mg via INTRAMUSCULAR
  Filled 2020-06-08: qty 500

## 2020-06-08 MED ORDER — SULFAMETHOXAZOLE-TRIMETHOPRIM 800-160 MG PO TABS: 1.0000 | ORAL_TABLET | Freq: Two times a day (BID) | ORAL | 0 refills | Status: AC

## 2020-06-08 MED ORDER — STERILE WATER FOR INJECTION IJ SOLN
INTRAMUSCULAR | Status: AC
  Filled 2020-06-08: qty 10

## 2020-06-08 NOTE — ED Triage Notes (Signed)
Pt reports STD exposure-states he was here recently for blood in urine-denies penile d/c-NAD-steady gait

## 2020-06-08 NOTE — ED Provider Notes (Signed)
[facility] EMERGENCY DEPARTMENT Provider Note   CSN: [id number] Arrival date & time: 06/08/20  1945     History Chief Complaint  Patient presents with  . Exposure to STD    Jordan Grimes is a 29 y.o. male with PMHx HTN and HIV on Biktarvy (currently undetectable) who presents to the ED today with complaint of exposure to STD. Pt reports that a new partner of his recently tested positive for gonorrhea. Pt reports he is here to seek treatment however does mention that he began noticing blood in his urine yesterday. Pt states he was seen in the ED yesterday for his symptoms however was not told anything.   Per chart review pt seen in the ED for "body aches, chest pain, and headache." He was tested for COVID and it returned negative. Pt reports that he is not having chest pain but more so left sided flank pain. He also complains of the hematuria, urinary frequency, and increased thirst. Pt does mention he had a fever the other day as well. No hx of kidney stones. No nausea, vomiting, dysuria, testicular pain/swelling, or any other associated symptoms.   The history is provided by the patient and medical records.       Past Medical History:  Diagnosis Date  . Hypertension     There are no problems to display for this patient.   Past Surgical History:  Procedure Laterality Date  . ANKLE SURGERY    . APPENDECTOMY    . HERNIA REPAIR    . MENISCUS REPAIR         No family history on file.  Social History   Tobacco Use  . Smoking status: Never Smoker  . Smokeless tobacco: Never Used  Vaping Use  . Vaping Use: Never used  Substance Use Topics  . Alcohol use: Yes    Comment: occ  . Drug use: Yes    Types: Marijuana    Home Medications Prior to Admission medications   Medication Sig Start Date End Date Taking? Authorizing Provider  albuterol (PROVENTIL HFA;VENTOLIN HFA) 108 (90 Base) MCG/ACT inhaler Inhale 2 puffs into the lungs every 4 (four) hours as  needed for wheezing or shortness of breath. 06/29/18   Joy, Shawn C, PA-C  benzonatate (TESSALON) 100 MG capsule Take 1 capsule (100 mg total) by mouth every 8 (eight) hours. 12/06/19   Joy, Shawn C, PA-C  Bictegravir-Emtricitab-Tenofov (BIKTARVY PO) Take by mouth.    [provider]  losartan (COZAAR) 25 MG tablet Take 25 mg by mouth daily.    [provider]  naproxen (NAPROSYN) 500 MG tablet Take 1 tablet (500 mg total) by mouth 2 (two) times daily. 10/18/19   Horton, Mayer Masker, MD  predniSONE (STERAPRED UNI-PAK 21 TAB) 10 MG (21) TBPK tablet Take 6 tabs (60mg ) day 1, 5 tabs (50mg ) day 2, 4 tabs (40mg ) day 3, 3 tabs (30mg ) day 4, 2 tabs (20mg ) day 5, and 1 tab (10mg ) day 6. 12/06/19   Joy, Shawn C, PA-C  sulfamethoxazole-trimethoprim (BACTRIM DS) 800-160 MG tablet Take 1 tablet by mouth 2 (two) times daily for 7 days. 06/08/20 06/15/20  , PA-C    Allergies    Patient has no known allergies.  Review of Systems   Review of Systems  Constitutional: Positive for chills and fever.  Gastrointestinal: Negative for nausea and vomiting.  Endocrine: Negative for polydipsia.  Genitourinary: Positive for flank pain, frequency and hematuria. Negative for discharge, penile pain and penile  swelling.  All other systems reviewed and are negative.   Physical Exam Updated Vital Signs BP (!) 153/97 (BP Location: Left Arm)   Pulse 99   Temp 99.3 F (37.4 C) (Oral)   Resp 18   SpO2 100%   Physical Exam Vitals and nursing note reviewed.  Constitutional:      Appearance: He is not ill-appearing.  HENT:     Head: Normocephalic and atraumatic.  Eyes:     Conjunctiva/sclera: Conjunctivae normal.  Cardiovascular:     Rate and Rhythm: Normal rate and regular rhythm.     Pulses: Normal pulses.  Pulmonary:     Effort: Pulmonary effort is normal.     Breath sounds: Normal breath sounds. No wheezing, rhonchi or rales.  Abdominal:     Palpations: Abdomen is soft.      Tenderness: There is no abdominal tenderness. There is left CVA tenderness. There is no guarding or rebound.  Musculoskeletal:     Cervical back: Neck supple.  Skin:    General: Skin is warm and dry.  Neurological:     Mental Status: He is alert.     ED Results / Procedures / Treatments   Labs (all labs ordered are listed, but only abnormal results are displayed) Labs Reviewed  URINALYSIS, ROUTINE W REFLEX MICROSCOPIC - Abnormal; Notable for the following components:      Result Value   APPearance CLOUDY (*)    Hgb urine dipstick TRACE (*)    Ketones, ur 15 (*)    Protein, ur 30 (*)    Leukocytes,Ua LARGE (*)    All other components within normal limits  URINALYSIS, MICROSCOPIC (REFLEX) - Abnormal; Notable for the following components:   Bacteria, UA FEW (*)    All other components within normal limits  CBC WITH DIFFERENTIAL/PLATELET - Abnormal; Notable for the following components:   Monocytes Absolute 1.1 (*)    All other components within normal limits  URINE CULTURE  BASIC METABOLIC PANEL  RPR  GC/CHLAMYDIA PROBE AMP ([place]) NOT AT [facility]    EKG None  Radiology DG Chest 2 View  Result Date: 06/07/2020 CLINICAL DATA:  Sharp chest pain and headache since 11 a.m. History of hypertension. EXAM: CHEST - 2 VIEW COMPARISON:  None. FINDINGS: Shallow inspiration. Heart size and pulmonary vascularity are normal for technique. No airspace disease or consolidation in the lungs. No pleural effusions. No pneumothorax. Mediastinal contours appear intact. IMPRESSION: No active cardiopulmonary disease. Electronically Signed   By: Burman Nieves M.D.   On: 06/07/2020 00:16   CT Renal Stone Study  Result Date: 06/08/2020 CLINICAL DATA:  Flank pain EXAM: CT ABDOMEN AND PELVIS WITHOUT CONTRAST TECHNIQUE: Multidetector CT imaging of the abdomen and pelvis was performed following the standard protocol without IV contrast. COMPARISON:  None. FINDINGS: Lower chest: Lung bases are  clear. No effusions. Heart is normal size. Hepatobiliary: No focal hepatic abnormality. Gallbladder unremarkable. Pancreas: No focal abnormality or ductal dilatation. Spleen: No focal abnormality.  Normal size. Adrenals/Urinary Tract: No adrenal abnormality. No focal renal abnormality. No stones or hydronephrosis. Urinary bladder is unremarkable. Stomach/Bowel: Stomach, large and small bowel grossly unremarkable. Vascular/Lymphatic: No evidence of aneurysm or adenopathy. Reproductive: No visible focal abnormality. Other: No free fluid or free air. Musculoskeletal: No acute bony abnormality. IMPRESSION: No renal or ureteral stones.  No hydronephrosis. No acute findings in the abdomen or pelvis. Electronically Signed   By: Charlett Nose M.D.   On: 06/08/2020 21:40    Procedures Procedures (including  critical care time)  Medications Ordered in ED Medications  cefTRIAXone (ROCEPHIN) injection 500 mg (500 mg Intramuscular Given 06/08/20 2218)  sterile water (preservative free) injection (  Given 06/08/20 2218)    ED Course  I have reviewed the triage vital signs and the nursing notes.  Pertinent labs & imaging results that were available during my care of the patient were reviewed by me and considered in my medical decision making (see chart for details).    MDM Rules/Calculators/A&P                          57 yaer old male presenting to the ED today after being told he was exposed to gonorrhea. He does however mention that he has been having hematuria and left sided pain. It appears he was seen in the ED yesterday for chest pain and body aches however pt states it is his flank and not all over (no body aches) and no chest pain. He did not have labs or U/A done at that time. Will plan to assess urinalysis at this time. If any acute findgins will plan for labwork. Will add on gc chlamydia at this time however given exposure will treat. Pt is HIV positive however on biktarvy and undetectable. He is  supposed to see ID in 1 month to get labwork done including RPR. If no labwork required today will not check RPR however if we do check labs pt is okay checking for syphilis as well. No penile discharge, dysuria, testicular pain/swelling. Do not feel pt needs GU exam at this time.   U/A positive for trace hgb on dipstick, large leuks, > 50 WBCs. Will order labs at this time. Given pain and hematuria considering stone. Will plan for CT renal stone study as well.   CBC without leukocytosis. Hgb stable at 15.7 BMP without electrolyte abnormalities  CT scan negative  In the setting of WBCs, hgb, urinary frequency and flank pain will treat for UTI at this time especially given pt is immunocompromised with HIV. I had initially discussed treating co infection of gonorrhea and chlamydia however given his partner did not test positive for chlamydia and pt will be given abx for UTI will hold off on additional doxy at this time. Will treat with rocephin IM in the ED for gonorrhea and outpatient abx for UTI. Pt instructed to follow up with his PCP for recheck of his urine in 1-2 weeks. He is instructed to await chlamydia and syphilis results and to return if positive for treatment. Pt is in agreement with plan and stable for discharge home.   This note was prepared using Dragon voice recognition software and may include unintentional dictation errors due to the inherent limitations of voice recognition software.   Final Clinical Impression(s) / ED Diagnoses Final diagnoses:  Acute cystitis with hematuria  STD exposure    Rx / DC Orders ED Discharge Orders         Ordered    sulfamethoxazole-trimethoprim (BACTRIM DS) 800-160 MG tablet  2 times daily        06/08/20 2223           Discharge Instructions     Your urine appeared infectious today and we are going to treat you for a UTI. Please pick up antibiotic and take as prescribed. We have sent your urine for culture and will call in 2-3 days if  the antibiotic needs to be changed. Finish the entire course  of antibiotics and have your urine rechecked next week by your PCP.   We have treated you for gonorrhea today given your positive exposure. Please await your chlamydia and syphilis results prior to engaging in intercourse again. We will call you if you test positive. You can also log into my chart and check the results that way. Make sure all partners have been tested and treated.   Return to the ED IMMEDIATELY for any worsening symptoms       Tanda RockersVenter, Samiya Mervin, Cordelia Poche-C 06/08/20 2226    Gwyneth SproutPlunkett, Whitney, MD 06/08/20 2237

## 2020-06-08 NOTE — Discharge Instructions (Addendum)
Your urine appeared infectious today and we are going to treat you for a UTI. Please pick up antibiotic and take as prescribed. We have sent your urine for culture and will call in 2-3 days if the antibiotic needs to be changed. Finish the entire course of antibiotics and have your urine rechecked next week by your PCP.   We have treated you for gonorrhea today given your positive exposure. Please await your chlamydia and syphilis results prior to engaging in intercourse again. We will call you if you test positive. You can also log into my chart and check the results that way. Make sure all partners have been tested and treated.   Return to the ED IMMEDIATELY for any worsening symptoms

## 2020-06-09 ENCOUNTER — Encounter: Admit: 2020-06-09 | Payer: BLUE CROSS/BLUE SHIELD

## 2020-06-09 LAB — GC/CHLAMYDIA PROBE AMP (~~LOC~~) NOT AT ARMC
Chlamydia: NEGATIVE
Comment: NEGATIVE
Comment: NORMAL
Neisseria Gonorrhea: NEGATIVE

## 2020-06-09 LAB — RPR: RPR Ser Ql: NONREACTIVE

## 2020-06-10 LAB — URINE CULTURE

## 2020-06-19 ENCOUNTER — Ambulatory Visit: Admit: 2020-06-19 | Discharge: 2020-06-20

## 2020-06-19 DIAGNOSIS — J351 Hypertrophy of tonsils: Principal | ICD-10-CM

## 2020-06-20 DIAGNOSIS — Z01818 Encounter for other preprocedural examination: Principal | ICD-10-CM

## 2020-06-20 MED ORDER — LOSARTAN 25 MG TABLET
ORAL_TABLET | Freq: Every day | ORAL | 3 refills | 90 days | Status: CP
Start: 2020-06-20 — End: 2020-06-30

## 2020-06-29 ENCOUNTER — Encounter: Admit: 2020-06-29 | Discharge: 2020-06-30 | Payer: BLUE CROSS/BLUE SHIELD | Attending: Family | Primary: Family

## 2020-06-29 DIAGNOSIS — I10 Essential (primary) hypertension: Principal | ICD-10-CM

## 2020-06-29 DIAGNOSIS — F32A Depression, unspecified: Principal | ICD-10-CM

## 2020-06-29 DIAGNOSIS — Z7189 Other specified counseling: Principal | ICD-10-CM

## 2020-06-29 DIAGNOSIS — B20 Human immunodeficiency virus [HIV] disease: Principal | ICD-10-CM

## 2020-06-29 DIAGNOSIS — Z8616 History of COVID-19: Principal | ICD-10-CM

## 2020-06-29 DIAGNOSIS — K219 Gastro-esophageal reflux disease without esophagitis: Principal | ICD-10-CM

## 2020-06-29 DIAGNOSIS — Z113 Encounter for screening for infections with a predominantly sexual mode of transmission: Principal | ICD-10-CM

## 2020-06-29 DIAGNOSIS — Z79899 Other long term (current) drug therapy: Principal | ICD-10-CM

## 2020-07-03 ENCOUNTER — Encounter: Admit: 2020-07-03 | Discharge: 2020-07-04 | Payer: BLUE CROSS/BLUE SHIELD

## 2020-07-03 DIAGNOSIS — A64 Unspecified sexually transmitted disease: Principal | ICD-10-CM

## 2020-07-04 MED ORDER — VALSARTAN 40 MG TABLET
ORAL_TABLET | Freq: Every day | ORAL | 0 refills | 180.00000 days | Status: CP
Start: 2020-07-04 — End: ?

## 2020-08-15 ENCOUNTER — Emergency Department: Payer: HRSA Program

## 2020-08-15 ENCOUNTER — Emergency Department
Admission: EM | Admit: 2020-08-15 | Discharge: 2020-08-15 | Disposition: A | Discharge status: Home / Self Care | Payer: HRSA Program | Attending: Emergency Medicine | Admitting: Emergency Medicine

## 2020-08-15 ENCOUNTER — Other Ambulatory Visit: Payer: Self-pay

## 2020-08-15 DIAGNOSIS — Z79899 Other long term (current) drug therapy: Secondary | ICD-10-CM | POA: Diagnosis not present

## 2020-08-15 DIAGNOSIS — U071 COVID-19: Secondary | ICD-10-CM | POA: Diagnosis not present

## 2020-08-15 DIAGNOSIS — R059 Cough, unspecified: Secondary | ICD-10-CM | POA: Diagnosis present

## 2020-08-15 DIAGNOSIS — I10 Essential (primary) hypertension: Secondary | ICD-10-CM | POA: Diagnosis not present

## 2020-08-15 LAB — RESP PANEL BY RT-PCR (FLU A&B, COVID) ARPGX2
Influenza A by PCR: NEGATIVE
Influenza B by PCR: NEGATIVE
SARS Coronavirus 2 by RT PCR: POSITIVE — AB

## 2020-08-15 MED ORDER — METHYLPREDNISOLONE 4 MG PO TBPK: ORAL_TABLET | ORAL | 0 refills | Status: DC

## 2020-08-15 MED ORDER — AZITHROMYCIN 250 MG PO TABS: ORAL_TABLET | ORAL | 0 refills | Status: DC

## 2020-08-15 NOTE — ED Provider Notes (Signed)
[facility] Emergency Department Provider Note  ____________________________________________   Event Date/Time   First MD Initiated Contact with Patient 08/15/20 1106     (approximate)  I have reviewed the triage vital signs and the nursing notes.   HISTORY  Chief Complaint Cough    HPI Jordan Grimes is a 29 y.o. male presents emergency department with cough, congestion, sore throat and sharp pains in his chest when he coughs.  Patient states it hurts to take a deep breath.  States 12 people at work were diagnosed with Covid.  He had Covid 4 months ago and in November of last year.  States he was treated with monoclonal antibodies.  Has not had a Covid vaccine.  Did get a flu vaccine.  Did get a pneumonia vaccine.    Past Medical History:  Diagnosis Date  . Hypertension     There are no problems to display for this patient.   Past Surgical History:  Procedure Laterality Date  . ANKLE SURGERY    . APPENDECTOMY    . HERNIA REPAIR    . MENISCUS REPAIR      Prior to Admission medications   Medication Sig Start Date End Date Taking? Authorizing Provider  azithromycin (ZITHROMAX Z-PAK) 250 MG tablet 2 pills today then 1 pill a day for 4 days 08/15/20  Yes Costantino Kohlbeck, Roselyn Bering, PA-C  methylPREDNISolone (MEDROL DOSEPAK) 4 MG TBPK tablet Take 6 pills on day one then decrease by 1 pill each day 08/15/20  Yes Gorje Iyer, Roselyn Bering, PA-C  albuterol (PROVENTIL HFA;VENTOLIN HFA) 108 (90 Base) MCG/ACT inhaler Inhale 2 puffs into the lungs every 4 (four) hours as needed for wheezing or shortness of breath. 06/29/18   Joy, Shawn C, PA-C  Bictegravir-Emtricitab-Tenofov (BIKTARVY PO) Take by mouth.    [provider]  losartan (COZAAR) 25 MG tablet Take 25 mg by mouth daily.    [provider]  naproxen (NAPROSYN) 500 MG tablet Take 1 tablet (500 mg total) by mouth 2 (two) times daily. 10/18/19   Horton, Mayer Masker, MD    Allergies Patient has no  known allergies.  No family history on file.  Social History Social History   Tobacco Use  . Smoking status: Never Smoker  . Smokeless tobacco: Never Used  Vaping Use  . Vaping Use: Never used  Substance Use Topics  . Alcohol use: Yes    Comment: occ  . Drug use: Yes    Types: Marijuana    Review of Systems  Constitutional: Positive fever/chills Eyes: No visual changes. ENT: No sore throat. Respiratory: Positive cough Cardiovascular: Denies chest pain Gastrointestinal: Denies abdominal pain Genitourinary: Negative for dysuria. Musculoskeletal: Negative for back pain. Skin: Negative for rash. Psychiatric: no mood changes,     ____________________________________________   PHYSICAL EXAM:  VITAL SIGNS: ED Triage Vitals  Enc Vitals Group     BP 08/15/20 1051 (!) 141/95     Pulse Rate 08/15/20 1051 84     Resp 08/15/20 1051 20     Temp 08/15/20 1051 98.6 F (37 C)     Temp Source 08/15/20 1051 Oral     SpO2 08/15/20 1051 100 %     Weight 08/15/20 1052 273 lb (123.8 kg)     Height 08/15/20 1052 6\' 1"  (1.854 m)     Head Circumference --      Peak Flow --      Pain Score 08/15/20 1051 10     Pain Loc --  Pain Edu? --      Excl. in GC? --     Constitutional: Alert and oriented. Well appearing and in no acute distress. Eyes: Conjunctivae are normal.  Head: Atraumatic. Nose: No congestion/rhinnorhea. Mouth/Throat: Mucous membranes are moist.  Neck:  supple no lymphadenopathy noted Cardiovascular: Normal rate, regular rhythm. Heart sounds are normal Respiratory: Normal respiratory effort.  No retractions, lungs c t a  GU: deferred Musculoskeletal: FROM all extremities, warm and well perfused Neurologic:  Normal speech and language.  Skin:  Skin is warm, dry and intact. No rash noted. Psychiatric: Mood and affect are normal. Speech and behavior are normal.  ____________________________________________   LABS (all labs ordered are listed, but only  abnormal results are displayed)  Labs Reviewed  RESP PANEL BY RT-PCR (FLU A&B, COVID) ARPGX2 - Abnormal; Notable for the following components:      Result Value   SARS Coronavirus 2 by RT PCR POSITIVE (*)    All other components within normal limits   ____________________________________________   ____________________________________________  RADIOLOGY  Chest x-ray  ____________________________________________   PROCEDURES  Procedure(s) performed: No  Procedures    ____________________________________________   INITIAL IMPRESSION / ASSESSMENT AND PLAN / ED COURSE  Pertinent labs & imaging results that were available during my care of the patient were reviewed by me and considered in my medical decision making (see chart for details).   Patient is a 29 year old male presents with Covid and flu type symptoms.  See HPI.  Physical exam shows patient to appear stable.  He is afebrile here in the ED.  Remainder exams unremarkable  Chest x-ray, Covid/flu test   Chest x-ray is normal, Covid test is positive, flu test negative  I did explain the findings to the patient.  This is his third time having Covid.  Feel that he should obtain a vaccine.  We did have a long discussion about this.  He was given a prescription for Z-Pak and steroid pack.  Follow-up with his regular doctor as needed.  Return emergency department worsening.  Is discharged stable condition.  Jordan Grimes was evaluated in Emergency Department on 08/15/2020 for the symptoms described in the history of present illness. He was evaluated in the context of the global COVID-19 pandemic, which necessitated consideration that the patient might be at risk for infection with the SARS-CoV-2 virus that causes COVID-19. Institutional protocols and algorithms that pertain to the evaluation of patients at risk for COVID-19 are in a state of rapid change based on information released by regulatory bodies including the CDC  and federal and state organizations. These policies and algorithms were followed during the patient's care in the ED.    As part of my medical decision making, I reviewed the following data within the electronic MEDICAL RECORD NUMBER Nursing notes reviewed and incorporated, Labs reviewed , Old chart reviewed, Radiograph reviewed , Notes from prior ED visits and [place] Controlled Substance Database  ____________________________________________   FINAL CLINICAL IMPRESSION(S) / ED DIAGNOSES  Final diagnoses:  COVID-19      NEW MEDICATIONS STARTED DURING THIS VISIT:  New Prescriptions   AZITHROMYCIN (ZITHROMAX Z-PAK) 250 MG TABLET    2 pills today then 1 pill a day for 4 days   METHYLPREDNISOLONE (MEDROL DOSEPAK) 4 MG TBPK TABLET    Take 6 pills on day one then decrease by 1 pill each day     Note:  This document was prepared using Dragon voice recognition software and may include unintentional dictation errors.  Faythe Ghee, PA-C 08/15/20 1329    Jene Every, MD 08/15/20 [phone]

## 2020-08-15 NOTE — Discharge Instructions (Signed)
Follow up with your regular doctor if not improving in 3 days.  Return to the ER if worsening.  Take otc mucinex, vit c, zinc, and vit d to help your immune system Take the zpack and steroid pack as prescribed Quarantine for 14 days from onset of symptoms

## 2020-08-15 NOTE — ED Triage Notes (Signed)
Pt reports cough, congestion, sore throat, pt states that has sharp pains in chest when he coughs, moves, and takes a deep breath, pt reports that 12 people at his work have been diagnosed with covid, states that he had covid 4 months ago

## 2020-09-08 DIAGNOSIS — B20 Human immunodeficiency virus [HIV] disease: Principal | ICD-10-CM

## 2020-09-08 MED ORDER — BIKTARVY 50 MG-200 MG-25 MG TABLET
ORAL_TABLET | Freq: Every day | ORAL | 0 refills | 30.00000 days | Status: CP
Start: 2020-09-08 — End: 2020-09-21

## 2020-09-21 ENCOUNTER — Ambulatory Visit: Admit: 2020-09-21 | Discharge: 2020-09-22

## 2020-09-21 DIAGNOSIS — R85610 Atypical squamous cells of undetermined significance on cytologic smear of anus (ASC-US): Principal | ICD-10-CM

## 2020-09-21 DIAGNOSIS — Z23 Encounter for immunization: Principal | ICD-10-CM

## 2020-09-21 DIAGNOSIS — B20 Human immunodeficiency virus [HIV] disease: Principal | ICD-10-CM

## 2020-09-21 DIAGNOSIS — L501 Idiopathic urticaria: Principal | ICD-10-CM

## 2020-09-21 MED ORDER — DELSTRIGO 100 MG-300 MG-300 MG TABLET
ORAL_TABLET | Freq: Every day | ORAL | 11 refills | 0.00000 days | Status: CP
Start: 2020-09-21 — End: ?

## 2020-09-21 MED ORDER — HYDROXYZINE HCL 25 MG TABLET
ORAL_TABLET | Freq: Every evening | ORAL | 1 refills | 30.00000 days | Status: CP
Start: 2020-09-21 — End: ?

## 2020-11-24 ENCOUNTER — Emergency Department (HOSPITAL_BASED_OUTPATIENT_CLINIC_OR_DEPARTMENT_OTHER): Payer: Self-pay

## 2020-11-24 ENCOUNTER — Emergency Department (HOSPITAL_BASED_OUTPATIENT_CLINIC_OR_DEPARTMENT_OTHER)
Admission: EM | Admit: 2020-11-24 | Discharge: 2020-11-24 | Disposition: A | Discharge status: Home / Self Care | Payer: Self-pay | Attending: Emergency Medicine | Admitting: Emergency Medicine

## 2020-11-24 ENCOUNTER — Encounter (HOSPITAL_BASED_OUTPATIENT_CLINIC_OR_DEPARTMENT_OTHER): Payer: Self-pay

## 2020-11-24 ENCOUNTER — Other Ambulatory Visit: Payer: Self-pay

## 2020-11-24 DIAGNOSIS — Z21 Asymptomatic human immunodeficiency virus [HIV] infection status: Secondary | ICD-10-CM | POA: Insufficient documentation

## 2020-11-24 DIAGNOSIS — Z79899 Other long term (current) drug therapy: Secondary | ICD-10-CM | POA: Insufficient documentation

## 2020-11-24 DIAGNOSIS — I10 Essential (primary) hypertension: Secondary | ICD-10-CM | POA: Insufficient documentation

## 2020-11-24 DIAGNOSIS — J069 Acute upper respiratory infection, unspecified: Secondary | ICD-10-CM | POA: Insufficient documentation

## 2020-11-24 DIAGNOSIS — J029 Acute pharyngitis, unspecified: Secondary | ICD-10-CM | POA: Insufficient documentation

## 2020-11-24 LAB — GROUP A STREP BY PCR: Group A Strep by PCR: NOT DETECTED

## 2020-11-24 MED ORDER — PENICILLIN G BENZATHINE 1200000 UNIT/2ML IM SUSP
1.2000 10*6.[IU] | Freq: Once | INTRAMUSCULAR | Status: AC
  Administered 2020-11-24: 1.2 10*6.[IU] via INTRAMUSCULAR
  Filled 2020-11-24: qty 2

## 2020-11-24 MED ORDER — AZITHROMYCIN 250 MG PO TABS: ORAL_TABLET | ORAL | 0 refills | Status: DC

## 2020-11-24 NOTE — ED Provider Notes (Signed)
MSE was initiated and I personally evaluated the patient and placed orders (if any) at  4:31 PM on November 24, 2020.  The patient appears stable so that the remainder of the MSE may be completed by another provider.  Patient placed in Quick Look pathway, seen and evaluated   Chief Complaint: cough  HPI:   30 y.o. M who presents for evaluation of cough x 4 days. Reports some throat irritation as well. He is scheduled to get his tonsils out. Reports for the last 3 days he has had blood tinged sputum. He smokes marijuana. No cigarettes.   ROS: cough   Physical Exam:   Gen: No distress  Neuro: Awake and Alert  Skin: Warm    Focused Exam: Lungs CTAB. Tonsillar edema noted bilaterally.    Initiation of care has begun. The patient has been counseled on the process, plan, and necessity for staying for the completion/evaluation, and the remainder of the medical screening examination    Rosana Hoes 11/24/20 1633    Gwyneth Sprout, MD 11/24/20 2325

## 2020-11-24 NOTE — Discharge Instructions (Signed)
Like we discussed, I am prescribing an antibiotic called azithromycin.  You are going to take 2 pills the first day and then 1 pill/day for the next 4 days.  Please check the results of your strep test as well as the gonorrhea/chlamydia test on MyChart.  If you find you are positive for gonorrhea or chlamydia, you need to return the emergency department or health department immediately for treatment.  Please refrain from sexual intercourse until you receive these test results.  If you develop any new or worsening symptoms, please return to the emergency department.  Otherwise, please follow-up with your surgeon regarding your tonsillectomy later this month.  It was a pleasure to meet you.

## 2020-11-24 NOTE — ED Provider Notes (Signed)
[facility] EMERGENCY DEPARTMENT Provider Note   CSN: [id number] Arrival date & time: 11/24/20  1613     History Chief Complaint  Patient presents with  . Cough    Jordan Grimes is a 30 y.o. male.  HPI Patient is a 30 year old male with a history of HIV, recurrent strep pharyngitis, who presents emergency department due to sore throat, productive cough, intermittent headaches.  Symptom started about 4 days ago.  States that his cough is productive of and has a small amount of blood speckled sputum noted when coughing.  No chest pain or shortness of breath.  Reports a history of recurrent tonsillitis and actually has a tonsillectomy scheduled for later this month in [place].  He spoke to his surgeon Dr. Gabriel Cirri in [place] who recommended that he come to the emergency department and get a shot of Bicillin as well as a prescription for azithromycin.  Patient states he is on Pratt therapy for HIV.  Her records show an absolute CD4 count on February 3 of 945.     Past Medical History:  Diagnosis Date  . Hypertension     There are no problems to display for this patient.   Past Surgical History:  Procedure Laterality Date  . ANKLE SURGERY    . APPENDECTOMY    . HERNIA REPAIR    . MENISCUS REPAIR         No family history on file.  Social History   Tobacco Use  . Smoking status: Never Smoker  . Smokeless tobacco: Never Used  Vaping Use  . Vaping Use: Never used  Substance Use Topics  . Alcohol use: Yes    Comment: occ  . Drug use: Yes    Types: Marijuana    Home Medications Prior to Admission medications   Medication Sig Start Date End Date Taking? Authorizing Provider  albuterol (PROVENTIL HFA;VENTOLIN HFA) 108 (90 Base) MCG/ACT inhaler Inhale 2 puffs into the lungs every 4 (four) hours as needed for wheezing or shortness of breath. 06/29/18   Joy, Shawn C, PA-C  azithromycin (ZITHROMAX Z-PAK) 250 MG tablet 2 pills today then 1 pill a day for 4 days  11/24/20   Placido Sou, PA-C  Bictegravir-Emtricitab-Tenofov (BIKTARVY PO) Take by mouth.    [provider]  losartan (COZAAR) 25 MG tablet Take 25 mg by mouth daily.    [provider]  methylPREDNISolone (MEDROL DOSEPAK) 4 MG TBPK tablet Take 6 pills on day one then decrease by 1 pill each day 08/15/20   Faythe Ghee, PA-C  naproxen (NAPROSYN) 500 MG tablet Take 1 tablet (500 mg total) by mouth 2 (two) times daily. 10/18/19   Horton, Mayer Masker, MD    Allergies    Patient has no known allergies.  Review of Systems   Review of Systems  Constitutional: Negative for chills and fever.  HENT: Positive for congestion, rhinorrhea and sore throat. Negative for ear discharge and ear pain.   Respiratory: Positive for cough. Negative for shortness of breath.   Cardiovascular: Negative for chest pain.    Physical Exam Updated Vital Signs BP (!) 145/87 (BP Location: Left Arm)   Pulse 84   Temp 98.6 F (37 C) (Oral)   Resp 16   SpO2 97%   Physical Exam Vitals and nursing note reviewed.  Constitutional:      General: He is not in acute distress.    Appearance: Normal appearance. He is not ill-appearing, toxic-appearing or diaphoretic.  HENT:  Head: Normocephalic and atraumatic.     Right Ear: External ear normal.     Left Ear: External ear normal.     Nose: Nose normal.     Mouth/Throat:     Mouth: Mucous membranes are moist.     Pharynx: Oropharynx is clear. Posterior oropharyngeal erythema present. No oropharyngeal exudate.     Comments: 2+ tonsillar hypertrophy noted bilaterally.  Uvula midline.  Readily handling secretions.  No hot potato voice.  Diffuse erythema noted in the posterior oropharynx.  No exudates. Eyes:     General: No scleral icterus.       Right eye: No discharge.        Left eye: No discharge.     Extraocular Movements: Extraocular movements intact.     Conjunctiva/sclera: Conjunctivae normal.  Cardiovascular:     Rate and Rhythm:  Normal rate and regular rhythm.     Pulses: Normal pulses.     Heart sounds: Normal heart sounds. No murmur heard. No friction rub. No gallop.      Comments: Heart is regular rate and rhythm without murmurs, rubs, or gallops. Pulmonary:     Effort: Pulmonary effort is normal. No respiratory distress.     Breath sounds: Normal breath sounds. No stridor. No wheezing, rhonchi or rales.     Comments: Lungs are clear to auscultation bilaterally. Abdominal:     General: Abdomen is flat.     Tenderness: There is no abdominal tenderness.  Musculoskeletal:        General: Normal range of motion.     Cervical back: Normal range of motion and neck supple. No tenderness.  Skin:    General: Skin is warm and dry.  Neurological:     General: No focal deficit present.     Mental Status: He is alert and oriented to person, place, and time.  Psychiatric:        Mood and Affect: Mood normal.        Behavior: Behavior normal.     ED Results / Procedures / Treatments   Labs (all labs ordered are listed, but only abnormal results are displayed) Labs Reviewed  GROUP A STREP BY PCR  GC/CHLAMYDIA PROBE AMP () NOT AT [facility]   EKG None  Radiology DG Chest 2 View  Result Date: 11/24/2020 CLINICAL DATA:  Cough EXAM: CHEST - 2 VIEW COMPARISON:  None. FINDINGS: The heart size and mediastinal contours are within normal limits. Both lungs are clear. The visualized skeletal structures are unremarkable. IMPRESSION: No active cardiopulmonary disease. Electronically Signed   By: Jonna Clark M.D.   On: 11/24/2020 17:36    Procedures Procedures   Medications Ordered in ED Medications  penicillin g benzathine (BICILLIN LA) 1200000 UNIT/2ML injection 1.2 Million Units (1.2 Million Units Intramuscular Given 11/24/20 1850)    ED Course  I have reviewed the triage vital signs and the nursing notes.  Pertinent labs & imaging results that were available during my care of the patient were reviewed by me  and considered in my medical decision making (see chart for details).    MDM Rules/Calculators/A&P                          Pt is a 30 y.o. male who presents the emergency department with sore throat, productive cough, congestion.  Labs: Rapid strep test is pending. GC/chlamydia swab is pending.  Imaging: Chest x-ray is negative.  Edison Simon, PA-C, personally reviewed  and evaluated these images and lab results as part of my medical decision-making.  Patient has a history of recurrent tonsillitis.  He has a tonsillectomy scheduled for later this month.  Based on his surgeon's request, will give patient a shot of IM Bicillin.  I went ahead and had patient tested for strep at this visit and he is going to check these results on MyChart.  Also notes a history of HIV that is well controlled with a CD4 count of 945 on February 3.  Patient also notes a history of receptive oral sex.  We will also have nursing staff obtain a GC/chlamydia swab at the oropharynx in addition to the rapid strep test.  Patient is going to check these results on MyChart.  He knows that if either of these tests are positive that he needs to return the emergency department for treatment.  Patient also given a short course of azithromycin for his URI.  Discussed return precautions.  Recommended that he follow-up with his ENT.  Feel he is stable for discharge and he is agreeable.  His questions were answered and he was amicable at the time of discharge.  Note: Portions of this report may have been transcribed using voice recognition software. Every effort was made to ensure accuracy; however, inadvertent computerized transcription errors may be present.   Final Clinical Impression(s) / ED Diagnoses Final diagnoses:  Viral URI with cough  Sore throat    Rx / DC Orders ED Discharge Orders         Ordered    azithromycin (ZITHROMAX Z-PAK) 250 MG tablet        11/24/20 1840           Placido Sou,  PA-C 11/25/20 0908    Gwyneth Sprout, MD 12/02/20 2351

## 2020-11-24 NOTE — ED Triage Notes (Signed)
Pt c/o prod cough x 4 days-blood in sputum x 3 days-negative covid test 2 days ago-NAD-steady gait

## 2020-11-27 LAB — GC/CHLAMYDIA PROBE AMP (~~LOC~~) NOT AT ARMC
Chlamydia: NEGATIVE
Comment: NEGATIVE
Comment: NORMAL
Neisseria Gonorrhea: NEGATIVE

## 2020-12-07 ENCOUNTER — Ambulatory Visit: Admit: 2020-12-07 | Discharge: 2020-12-08

## 2020-12-07 DIAGNOSIS — B20 Human immunodeficiency virus [HIV] disease: Principal | ICD-10-CM

## 2020-12-07 DIAGNOSIS — R41840 Attention and concentration deficit: Principal | ICD-10-CM

## 2020-12-07 DIAGNOSIS — Z113 Encounter for screening for infections with a predominantly sexual mode of transmission: Principal | ICD-10-CM

## 2020-12-11 DIAGNOSIS — J351 Hypertrophy of tonsils: Principal | ICD-10-CM

## 2021-01-05 ENCOUNTER — Encounter: Admit: 2021-01-05 | Discharge: 2021-01-05 | Attending: Specialist | Primary: Specialist

## 2021-01-05 ENCOUNTER — Ambulatory Visit: Admit: 2021-01-05 | Discharge: 2021-01-05

## 2021-01-05 MED ORDER — DOCUSATE SODIUM 100 MG CAPSULE
ORAL_CAPSULE | Freq: Two times a day (BID) | ORAL | 0 refills | 14.00000 days | Status: CP
Start: 2021-01-05 — End: 2021-01-19
  Filled 2021-01-05: qty 28, 14d supply, fill #0

## 2021-01-05 MED ORDER — CELECOXIB 100 MG CAPSULE
ORAL_CAPSULE | Freq: Two times a day (BID) | ORAL | 0 refills | 14 days | Status: CP
Start: 2021-01-05 — End: 2021-01-19
  Filled 2021-01-05: qty 28, 14d supply, fill #0

## 2021-01-05 MED ORDER — OXYCODONE 5 MG TABLET
ORAL_TABLET | Freq: Four times a day (QID) | ORAL | 0 refills | 5.00000 days | Status: CP | PRN
Start: 2021-01-05 — End: 2021-01-10
  Filled 2021-01-05: qty 40, 5d supply, fill #0

## 2021-01-05 MED ORDER — METHYLPREDNISOLONE 4 MG TABLETS IN A DOSE PACK
0 refills | 0 days | Status: CP
Start: 2021-01-05 — End: ?
  Filled 2021-01-05: qty 21, 6d supply, fill #0

## 2021-01-05 MED ORDER — ONDANSETRON 4 MG DISINTEGRATING TABLET
ORAL_TABLET | Freq: Three times a day (TID) | ORAL | 0 refills | 7.00000 days | Status: CP | PRN
Start: 2021-01-05 — End: 2021-02-04
  Filled 2021-01-05: qty 20, 6d supply, fill #0

## 2021-01-09 MED ORDER — OXYCODONE 5 MG TABLET
ORAL_TABLET | Freq: Four times a day (QID) | ORAL | 0 refills | 5.00000 days | Status: CP | PRN
Start: 2021-01-09 — End: 2021-01-16

## 2021-01-18 MED ORDER — VALSARTAN 40 MG TABLET
ORAL_TABLET | Freq: Every day | ORAL | 0 refills | 180 days | Status: CP
Start: 2021-01-18 — End: ?

## 2021-02-15 ENCOUNTER — Ambulatory Visit
Admit: 2021-02-15 | Discharge: 2021-02-16 | Attending: Student in an Organized Health Care Education/Training Program | Primary: Student in an Organized Health Care Education/Training Program

## 2021-02-15 MED ORDER — DEXTROAMPHETAMINE-AMPHETAMINE ER 10 MG 24HR CAPSULE,EXTEND RELEASE
ORAL_CAPSULE | Freq: Every morning | ORAL | 0 refills | 30 days | Status: CP
Start: 2021-02-15 — End: 2021-03-16

## 2021-03-08 MED ORDER — DEXTROAMPHETAMINE-AMPHETAMINE ER 10 MG 24HR CAPSULE,EXTEND RELEASE
ORAL_CAPSULE | Freq: Every morning | ORAL | 0 refills | 15 days | Status: CP
Start: 2021-03-08 — End: ?

## 2021-03-09 ENCOUNTER — Institutional Professional Consult (permissible substitution): Admit: 2021-03-09 | Discharge: 2021-03-10 | Attending: Clinical | Primary: Clinical

## 2021-03-22 ENCOUNTER — Ambulatory Visit: Admit: 2021-03-22 | Discharge: 2021-03-22 | Attending: Family | Primary: Family

## 2021-03-22 ENCOUNTER — Ambulatory Visit: Admit: 2021-03-22 | Discharge: 2021-03-22 | Attending: Psychiatry | Primary: Psychiatry

## 2021-03-22 DIAGNOSIS — F900 Attention-deficit hyperactivity disorder, predominantly inattentive type: Principal | ICD-10-CM

## 2021-03-22 DIAGNOSIS — B20 Human immunodeficiency virus [HIV] disease: Principal | ICD-10-CM

## 2021-03-22 DIAGNOSIS — R002 Palpitations: Principal | ICD-10-CM

## 2021-03-22 DIAGNOSIS — Z113 Encounter for screening for infections with a predominantly sexual mode of transmission: Principal | ICD-10-CM

## 2021-03-22 MED ORDER — DELSTRIGO 100 MG-300 MG-300 MG TABLET
ORAL_TABLET | Freq: Every day | ORAL | 6 refills | 0.00000 days | Status: CP
Start: 2021-03-22 — End: ?

## 2021-03-22 MED ORDER — DEXTROAMPHETAMINE-AMPHETAMINE ER 10 MG 24HR CAPSULE,EXTEND RELEASE
ORAL_CAPSULE | Freq: Every morning | ORAL | 0 refills | 30.00000 days | Status: CP
Start: 2021-03-22 — End: ?

## 2021-04-15 ENCOUNTER — Encounter (HOSPITAL_COMMUNITY): Payer: Self-pay

## 2021-04-15 ENCOUNTER — Other Ambulatory Visit: Payer: Self-pay

## 2021-04-15 ENCOUNTER — Emergency Department (HOSPITAL_COMMUNITY)
Admission: EM | Admit: 2021-04-15 | Discharge: 2021-04-16 | Disposition: A | Discharge status: Home / Self Care | Payer: Self-pay | Attending: Emergency Medicine | Admitting: Emergency Medicine

## 2021-04-15 DIAGNOSIS — I10 Essential (primary) hypertension: Secondary | ICD-10-CM | POA: Insufficient documentation

## 2021-04-15 DIAGNOSIS — R112 Nausea with vomiting, unspecified: Secondary | ICD-10-CM | POA: Insufficient documentation

## 2021-04-15 DIAGNOSIS — R1011 Right upper quadrant pain: Secondary | ICD-10-CM | POA: Insufficient documentation

## 2021-04-15 NOTE — ED Triage Notes (Signed)
Pt complains of dull RUQ pain and vomiting/nausea since yesterday.

## 2021-04-16 ENCOUNTER — Other Ambulatory Visit (HOSPITAL_BASED_OUTPATIENT_CLINIC_OR_DEPARTMENT_OTHER): Payer: Self-pay

## 2021-04-16 ENCOUNTER — Emergency Department (HOSPITAL_COMMUNITY): Payer: Self-pay

## 2021-04-16 ENCOUNTER — Encounter (HOSPITAL_COMMUNITY): Payer: Self-pay | Admitting: Emergency Medicine

## 2021-04-16 LAB — COMPREHENSIVE METABOLIC PANEL
ALT: 23 U/L (ref 0–44)
AST: 16 U/L (ref 15–41)
Albumin: 4.5 g/dL (ref 3.5–5.0)
Alkaline Phosphatase: 79 U/L (ref 38–126)
Anion gap: 5 (ref 5–15)
BUN: 7 mg/dL (ref 6–20)
CO2: 27 mmol/L (ref 22–32)
Calcium: 9.6 mg/dL (ref 8.9–10.3)
Chloride: 105 mmol/L (ref 98–111)
Creatinine, Ser: 0.72 mg/dL (ref 0.61–1.24)
GFR, Estimated: >60 mL/min (ref >60)
Glucose, Bld: 101 mg/dL — ABNORMAL HIGH (ref 70–99)
Potassium: 3.5 mmol/L (ref 3.5–5.1)
Sodium: 137 mmol/L (ref 135–145)
Total Bilirubin: 0.5 mg/dL (ref 0.3–1.2)
Total Protein: 7.8 g/dL (ref 6.5–8.1)

## 2021-04-16 LAB — URINALYSIS, ROUTINE W REFLEX MICROSCOPIC
Bilirubin Urine: NEGATIVE
Glucose, UA: NEGATIVE mg/dL
Hgb urine dipstick: NEGATIVE
Ketones, ur: NEGATIVE mg/dL
Leukocytes,Ua: NEGATIVE
Nitrite: NEGATIVE
Protein, ur: NEGATIVE mg/dL
Specific Gravity, Urine: 1.017 (ref 1.005–1.030)
pH: 8 (ref 5.0–8.0)

## 2021-04-16 LAB — CBC WITH DIFFERENTIAL/PLATELET
Abs Immature Granulocytes: 0.04 10*3/uL (ref 0.00–0.07)
Basophils Absolute: 0 10*3/uL (ref 0.0–0.1)
Basophils Relative: 0 %
Eosinophils Absolute: 0.2 10*3/uL (ref 0.0–0.5)
Eosinophils Relative: 3 %
HCT: 49.4 % (ref 39.0–52.0)
Hemoglobin: 16 g/dL (ref 13.0–17.0)
Immature Granulocytes: 1 %
Lymphocytes Relative: 38 %
Lymphs Abs: 2.8 10*3/uL (ref 0.7–4.0)
MCH: 28.8 pg (ref 26.0–34.0)
MCHC: 32.4 g/dL (ref 30.0–36.0)
MCV: 89 fL (ref 80.0–100.0)
Monocytes Absolute: 0.4 10*3/uL (ref 0.1–1.0)
Monocytes Relative: 5 %
Neutro Abs: 3.9 10*3/uL (ref 1.7–7.7)
Neutrophils Relative %: 53 %
Platelets: 286 10*3/uL (ref 150–400)
RBC: 5.55 MIL/uL (ref 4.22–5.81)
RDW: 14.4 % (ref 11.5–15.5)
WBC: 7.4 10*3/uL (ref 4.0–10.5)
nRBC: 0 % (ref 0.0–0.2)

## 2021-04-16 LAB — LIPASE, BLOOD: Lipase: 30 U/L (ref 11–51)

## 2021-04-16 MED ORDER — ALUM & MAG HYDROXIDE-SIMETH 200-200-20 MG/5ML PO SUSP
30.0000 mL | Freq: Once | ORAL | Status: AC
  Administered 2021-04-16: 30 mL via ORAL
  Filled 2021-04-16: qty 30

## 2021-04-16 MED ORDER — DICYCLOMINE HCL 10 MG/ML IM SOLN
20.0000 mg | Freq: Once | INTRAMUSCULAR | Status: AC
  Administered 2021-04-16: 20 mg via INTRAMUSCULAR
  Filled 2021-04-16: qty 2

## 2021-04-16 MED ORDER — OMEPRAZOLE 20 MG PO CPDR
20.0000 mg | DELAYED_RELEASE_CAPSULE | Freq: Every day | ORAL | 0 refills | Status: AC
  Filled 2021-04-16: qty 30, 30d supply, fill #0

## 2021-04-16 MED ORDER — DICYCLOMINE HCL 20 MG PO TABS
20.0000 mg | ORAL_TABLET | Freq: Two times a day (BID) | ORAL | 0 refills | Status: AC
  Filled 2021-04-16: qty 20, 10d supply, fill #0

## 2021-04-16 MED ORDER — DEXTROAMPHETAMINE-AMPHETAMINE ER 10 MG 24HR CAPSULE,EXTEND RELEASE
ORAL_CAPSULE | Freq: Every morning | ORAL | 0 refills | 30.00000 days | Status: CP
Start: 2021-04-16 — End: 2021-05-15

## 2021-04-16 NOTE — ED Provider Notes (Signed)
[facility] Provider Note   CSN: [id number] Arrival date & time: 04/15/21  2117     History Chief Complaint  Patient presents with   Abdominal Pain   Nausea   Emesis    Jordan Grimes is a 30 y.o. male.  The history is provided by the patient.  Abdominal Pain Pain location:  Generalized Pain quality: not aching   Pain radiates to:  Does not radiate Pain severity:  Moderate Onset quality:  Gradual Duration:  2 days Timing:  Constant Progression:  Unchanged Chronicity:  New Context: not diet changes and not eating   Relieved by:  Nothing Worsened by:  Nothing Ineffective treatments:  None tried Associated symptoms: nausea   Associated symptoms: no belching, no chest pain, no fever and no shortness of breath   Risk factors: not obese   Patient states upper abdominal pain x 2 days, mainly RUQ with nausea but has not actually vomited.  Normal BMs.  No f/c/r.      Past Medical History:  Diagnosis Date   Hypertension     There are no problems to display for this patient.   Past Surgical History:  Procedure Laterality Date   ANKLE SURGERY     APPENDECTOMY     HERNIA REPAIR     MENISCUS REPAIR         History reviewed. No pertinent family history.  Social History   Tobacco Use   Smoking status: Never   Smokeless tobacco: Never  Vaping Use   Vaping Use: Never used  Substance Use Topics   Alcohol use: Yes    Comment: occ   Drug use: Yes    Types: Marijuana    Home Medications Prior to Admission medications   Medication Sig Start Date End Date Taking? Authorizing Provider  albuterol (PROVENTIL HFA;VENTOLIN HFA) 108 (90 Base) MCG/ACT inhaler Inhale 2 puffs into the lungs every 4 (four) hours as needed for wheezing or shortness of breath. 06/29/18  Yes Joy, Shawn C, PA-C  amphetamine-dextroamphetamine (ADDERALL XR) 10 MG 24 hr capsule Take 10 mg by mouth every morning. 03/23/21  Yes [provider]   doravirin-lamivudin-tenofov df (DELSTRIGO) 100-300-300 MG TABS per tablet Take 1 tablet by mouth daily. 03/22/21  Yes [provider]  losartan (COZAAR) 25 MG tablet Take 25 mg by mouth daily.   Yes [provider]  naproxen (NAPROSYN) 500 MG tablet Take 1 tablet (500 mg total) by mouth 2 (two) times daily. 10/18/19  Yes Horton, Mayer Maskerourtney F, MD  azithromycin (ZITHROMAX Z-PAK) 250 MG tablet 2 pills today then 1 pill a day for 4 days Patient not taking: Reported on 04/16/2021 11/24/20   Placido SouJoldersma, Logan, PA-C  Bictegravir-Emtricitab-Tenofov (BIKTARVY PO) Take by mouth. Patient not taking: Reported on 04/16/2021    [provider]  methylPREDNISolone (MEDROL DOSEPAK) 4 MG TBPK tablet Take 6 pills on day one then decrease by 1 pill each day Patient not taking: Reported on 04/16/2021 08/15/20   Faythe GheeFisher, Susan W, PA-C    Allergies    Patient has no known allergies.  Review of Systems   Review of Systems  Constitutional:  Negative for fever.  HENT:  Negative for congestion.   Eyes:  Negative for redness.  Respiratory:  Negative for shortness of breath.   Cardiovascular:  Negative for chest pain.  Gastrointestinal:  Positive for abdominal pain and nausea.  Genitourinary:  Negative for difficulty urinating.  Musculoskeletal:  Negative for neck stiffness.  Skin:  Negative  for rash.  Neurological:  Negative for facial asymmetry.  Psychiatric/Behavioral:  Negative for agitation.   All other systems reviewed and are negative.  Physical Exam Updated Vital Signs BP (!) 140/94   Pulse 75   Temp 98.6 F (37 C) (Oral)   Resp 20   SpO2 100%   Physical Exam Vitals and nursing note reviewed.  Constitutional:      General: He is not in acute distress.    Appearance: Normal appearance.  HENT:     Head: Normocephalic and atraumatic.     Nose: Nose normal.  Eyes:     Conjunctiva/sclera: Conjunctivae normal.     Pupils: Pupils are equal, round, and reactive to light.   Cardiovascular:     Rate and Rhythm: Normal rate and regular rhythm.     Pulses: Normal pulses.     Heart sounds: Normal heart sounds.  Pulmonary:     Effort: Pulmonary effort is normal.     Breath sounds: Normal breath sounds.  Abdominal:     General: Abdomen is flat. Bowel sounds are normal.     Palpations: Abdomen is soft.     Tenderness: There is no abdominal tenderness. There is no guarding or rebound.  Musculoskeletal:        General: Normal range of motion.     Cervical back: Normal range of motion and neck supple.  Skin:    General: Skin is warm and dry.     Capillary Refill: Capillary refill takes less than 2 seconds.  Neurological:     General: No focal deficit present.     Mental Status: He is alert and oriented to person, place, and time.     Deep Tendon Reflexes: Reflexes normal.  Psychiatric:        Mood and Affect: Mood normal.        Behavior: Behavior normal.    ED Results / Procedures / Treatments   Labs (all labs ordered are listed, but only abnormal results are displayed) Results for orders placed or performed during the hospital encounter of 04/15/21  CBC with Differential  Result Value Ref Range   WBC 7.4 4.0 - 10.5 K/uL   RBC 5.55 4.22 - 5.81 MIL/uL   Hemoglobin 16.0 13.0 - 17.0 g/dL   HCT 54.2 70.6 - 23.7 %   MCV 89.0 80.0 - 100.0 fL   MCH 28.8 26.0 - 34.0 pg   MCHC 32.4 30.0 - 36.0 g/dL   RDW 62.8 31.5 - 17.6 %   Platelets 286 150 - 400 K/uL   nRBC 0.0 0.0 - 0.2 %   Neutrophils Relative % 53 %   Neutro Abs 3.9 1.7 - 7.7 K/uL   Lymphocytes Relative 38 %   Lymphs Abs 2.8 0.7 - 4.0 K/uL   Monocytes Relative 5 %   Monocytes Absolute 0.4 0.1 - 1.0 K/uL   Eosinophils Relative 3 %   Eosinophils Absolute 0.2 0.0 - 0.5 K/uL   Basophils Relative 0 %   Basophils Absolute 0.0 0.0 - 0.1 K/uL   Immature Granulocytes 1 %   Abs Immature Granulocytes 0.04 0.00 - 0.07 K/uL  Comprehensive metabolic panel  Result Value Ref Range   Sodium 137 135 - 145  mmol/L   Potassium 3.5 3.5 - 5.1 mmol/L   Chloride 105 98 - 111 mmol/L   CO2 27 22 - 32 mmol/L   Glucose, Bld 101 (H) 70 - 99 mg/dL   BUN 7 6 - 20 mg/dL   Creatinine,  Ser 0.72 0.61 - 1.24 mg/dL   Calcium 9.6 8.9 - 93.7 mg/dL   Total Protein 7.8 6.5 - 8.1 g/dL   Albumin 4.5 3.5 - 5.0 g/dL   AST 16 15 - 41 U/L   ALT 23 0 - 44 U/L   Alkaline Phosphatase 79 38 - 126 U/L   Total Bilirubin 0.5 0.3 - 1.2 mg/dL   GFR, Estimated >90 >24 mL/min   Anion gap 5 5 - 15  Lipase, blood  Result Value Ref Range   Lipase 30 11 - 51 U/L  Urinalysis, Routine w reflex microscopic Urine, Clean Catch  Result Value Ref Range   Color, Urine YELLOW YELLOW   APPearance CLOUDY (A) CLEAR   Specific Gravity, Urine 1.017 1.005 - 1.030   pH 8.0 5.0 - 8.0   Glucose, UA NEGATIVE NEGATIVE mg/dL   Hgb urine dipstick NEGATIVE NEGATIVE   Bilirubin Urine NEGATIVE NEGATIVE   Ketones, ur NEGATIVE NEGATIVE mg/dL   Protein, ur NEGATIVE NEGATIVE mg/dL   Nitrite NEGATIVE NEGATIVE   Leukocytes,Ua NEGATIVE NEGATIVE   US Abdomen Limited  Result Date: 04/16/2021 CLINICAL DATA:  Right upper quadrant pain x1 day EXAM: ULTRASOUND ABDOMEN LIMITED RIGHT UPPER QUADRANT COMPARISON:  None. FINDINGS: Gallbladder: Small gallbladder polyps measuring up to 4 mm. No gallstones or gallbladder wall thickening. Negative sonographic Murphy's sign. Common bile duct: Diameter: 3 mm Liver: No focal lesion identified. Within normal limits in parenchymal echogenicity. Portal vein is patent on color Doppler imaging with normal direction of blood flow towards the liver. Other: None. IMPRESSION: Small gallbladder polyps measuring up to 4 mm. Given the small size, dedicated sonographic follow-up is not required. No findings suspicious for acute cholecystitis. Electronically Signed   By: Charline Bills M.D.   On: 04/16/2021 02:27     Radiology US Abdomen Limited  Result Date: 04/16/2021 CLINICAL DATA:  Right upper quadrant pain x1 day EXAM:  ULTRASOUND ABDOMEN LIMITED RIGHT UPPER QUADRANT COMPARISON:  None. FINDINGS: Gallbladder: Small gallbladder polyps measuring up to 4 mm. No gallstones or gallbladder wall thickening. Negative sonographic Murphy's sign. Common bile duct: Diameter: 3 mm Liver: No focal lesion identified. Within normal limits in parenchymal echogenicity. Portal vein is patent on color Doppler imaging with normal direction of blood flow towards the liver. Other: None. IMPRESSION: Small gallbladder polyps measuring up to 4 mm. Given the small size, dedicated sonographic follow-up is not required. No findings suspicious for acute cholecystitis. Electronically Signed   By: Charline Bills M.D.   On: 04/16/2021 02:27    Procedures Procedures   Medications Ordered in ED Medications  alum & mag hydroxide-simeth (MAALOX/MYLANTA) 200-200-20 MG/5ML suspension 30 mL (30 mLs Oral Given 04/16/21 0244)  dicyclomine (BENTYL) injection 20 mg (20 mg Intramuscular Given 04/16/21 0245)    ED Course  I have reviewed the triage vital signs and the nursing notes.  Pertinent labs & imaging results that were available during my care of the patient were reviewed by me and considered in my medical decision making (see chart for details).   Exam and vitals and labs are benign and reassuring.  Ultrasound is negative for cholecystitis. Will start PPI and antispasmotics and have patient follow up with PMD.  Strict return precautions given.    Jordan Grimes was evaluated in Emergency Department on 04/16/2021 for the symptoms described in the history of present illness. He was evaluated in the context of the global COVID-19 pandemic, which necessitated consideration that the patient might be at risk for infection  with the SARS-CoV-2 virus that causes COVID-19. Institutional protocols and algorithms that pertain to the evaluation of patients at risk for COVID-19 are in a state of rapid change based on information released by regulatory bodies  including the CDC and federal and state organizations. These policies and algorithms were followed during the patient's care in the ED.    Final Clinical Impression(s) / ED Diagnoses Return for intractable cough, coughing up blood, fevers > 100.4 unrelieved by medication, shortness of breath, intractable vomiting, chest pain, shortness of breath, weakness, numbness, changes in speech, facial asymmetry, abdominal pain, passing out, Inability to tolerate liquids or food, cough, altered mental status or any concerns. No signs of systemic illness or infection. The patient is nontoxic-appearing on exam and vital signs are within normal limits. I have reviewed the triage vital signs and the nursing notes. Pertinent labs & imaging results that were available during my care of the patient were reviewed by me and considered in my medical decision making (see chart for details). After history, exam, and medical workup I feel the patient has been appropriately medically screened and is safe for discharge home. Pertinent diagnoses were discussed with the patient. Patient was given return precautions.  Rx / DC Orders ED Discharge Orders     None        Kynslei Art, MD 04/16/21 2542

## 2021-04-18 ENCOUNTER — Ambulatory Visit: Admit: 2021-04-18 | Discharge: 2021-04-19 | Payer: MEDICAID

## 2021-04-18 DIAGNOSIS — K279 Peptic ulcer, site unspecified, unspecified as acute or chronic, without hemorrhage or perforation: Principal | ICD-10-CM

## 2021-04-18 MED ORDER — SUCRALFATE 100 MG/ML ORAL SUSPENSION
Freq: Four times a day (QID) | ORAL | 1 refills | 11 days | Status: CP
Start: 2021-04-18 — End: 2022-04-18

## 2021-04-19 ENCOUNTER — Other Ambulatory Visit (HOSPITAL_BASED_OUTPATIENT_CLINIC_OR_DEPARTMENT_OTHER): Payer: Self-pay

## 2021-04-21 MED ORDER — ADDERALL XR 10 MG CAPSULE,EXTENDED RELEASE
ORAL_CAPSULE | Freq: Every morning | ORAL | 0 refills | 30 days | Status: CP
Start: 2021-04-21 — End: ?

## 2021-04-26 ENCOUNTER — Ambulatory Visit: Admit: 2021-04-26

## 2021-04-28 ENCOUNTER — Emergency Department: Admit: 2021-04-28 | Discharge: 2021-04-28 | Disposition: A | Discharge status: Home / Self Care | Payer: MEDICAID

## 2021-04-28 ENCOUNTER — Ambulatory Visit: Admit: 2021-04-28 | Discharge: 2021-04-28 | Disposition: A | Discharge status: Home / Self Care | Payer: MEDICAID

## 2021-04-28 DIAGNOSIS — J069 Acute upper respiratory infection, unspecified: Principal | ICD-10-CM

## 2021-06-08 ENCOUNTER — Institutional Professional Consult (permissible substitution): Admit: 2021-06-08 | Discharge: 2021-06-09

## 2021-06-08 DIAGNOSIS — B20 Human immunodeficiency virus [HIV] disease: Principal | ICD-10-CM

## 2021-06-08 DIAGNOSIS — Z202 Contact with and (suspected) exposure to infections with a predominantly sexual mode of transmission: Principal | ICD-10-CM

## 2021-06-08 DIAGNOSIS — Z113 Encounter for screening for infections with a predominantly sexual mode of transmission: Principal | ICD-10-CM

## 2021-06-08 MED ORDER — DOXYCYCLINE MONOHYDRATE 100 MG CAPSULE
ORAL_CAPSULE | Freq: Two times a day (BID) | ORAL | 0 refills | 7.00000 days | Status: CP
Start: 2021-06-08 — End: 2021-06-15
  Filled 2021-06-08: qty 14, 7d supply, fill #0

## 2021-06-14 ENCOUNTER — Telehealth
Admit: 2021-06-14 | Discharge: 2021-06-15 | Attending: Student in an Organized Health Care Education/Training Program | Primary: Student in an Organized Health Care Education/Training Program

## 2021-06-14 DIAGNOSIS — F900 Attention-deficit hyperactivity disorder, predominantly inattentive type: Principal | ICD-10-CM

## 2021-06-14 MED ORDER — DEXTROAMPHETAMINE-AMPHETAMINE ER 10 MG 24HR CAPSULE,EXTEND RELEASE
ORAL_CAPSULE | Freq: Two times a day (BID) | ORAL | 0 refills | 30 days | Status: CP
Start: 2021-06-14 — End: 2021-07-14

## 2021-07-02 MED ORDER — DEXTROAMPHETAMINE-AMPHETAMINE ER 10 MG 24HR CAPSULE,EXTEND RELEASE
ORAL_CAPSULE | Freq: Two times a day (BID) | ORAL | 0 refills | 30.00000 days | Status: CP
Start: 2021-07-02 — End: 2021-07-02

## 2021-07-14 MED ORDER — DEXTROAMPHETAMINE-AMPHETAMINE ER 10 MG 24HR CAPSULE,EXTEND RELEASE
ORAL_CAPSULE | Freq: Two times a day (BID) | ORAL | 0 refills | 30 days | Status: CP
Start: 2021-07-14 — End: 2021-08-13

## 2021-07-27 MED ORDER — VALSARTAN 40 MG TABLET
ORAL_TABLET | 0 refills | 0 days | Status: CP
Start: 2021-07-27 — End: ?

## 2021-08-01 MED ORDER — VALSARTAN 40 MG TABLET
ORAL_TABLET | Freq: Every day | ORAL | 0 refills | 180 days | Status: CP
Start: 2021-08-01 — End: ?

## 2021-08-06 ENCOUNTER — Institutional Professional Consult (permissible substitution): Admit: 2021-08-06 | Discharge: 2021-08-07

## 2021-08-06 DIAGNOSIS — Z202 Contact with and (suspected) exposure to infections with a predominantly sexual mode of transmission: Principal | ICD-10-CM

## 2021-08-13 MED ORDER — DEXTROAMPHETAMINE-AMPHETAMINE ER 10 MG 24HR CAPSULE,EXTEND RELEASE
ORAL_CAPSULE | Freq: Two times a day (BID) | ORAL | 0 refills | 30.00000 days | Status: CP
Start: 2021-08-13 — End: 2021-09-12

## 2021-09-10 ENCOUNTER — Ambulatory Visit: Admit: 2021-09-10 | Discharge: 2021-09-11 | Attending: Family | Primary: Family

## 2021-09-10 DIAGNOSIS — Z113 Encounter for screening for infections with a predominantly sexual mode of transmission: Principal | ICD-10-CM

## 2021-09-10 DIAGNOSIS — B20 Human immunodeficiency virus [HIV] disease: Principal | ICD-10-CM

## 2021-09-10 DIAGNOSIS — J029 Acute pharyngitis, unspecified: Principal | ICD-10-CM

## 2021-09-10 MED ORDER — DELSTRIGO 100 MG-300 MG-300 MG TABLET
ORAL_TABLET | Freq: Every day | ORAL | 6 refills | 0.00000 days | Status: CP
Start: 2021-09-10 — End: ?

## 2021-09-13 ENCOUNTER — Telehealth
Admit: 2021-09-13 | Discharge: 2021-09-14 | Attending: Student in an Organized Health Care Education/Training Program | Primary: Student in an Organized Health Care Education/Training Program

## 2021-09-13 DIAGNOSIS — B20 Human immunodeficiency virus [HIV] disease: Principal | ICD-10-CM

## 2021-09-13 DIAGNOSIS — F900 Attention-deficit hyperactivity disorder, predominantly inattentive type: Principal | ICD-10-CM

## 2021-09-13 MED ORDER — DEXTROAMPHETAMINE-AMPHETAMINE 10 MG TABLET
ORAL_TABLET | 0 refills | 0 days | Status: CP
Start: 2021-09-13 — End: ?

## 2021-09-16 ENCOUNTER — Other Ambulatory Visit: Payer: Self-pay

## 2021-09-16 ENCOUNTER — Emergency Department (HOSPITAL_BASED_OUTPATIENT_CLINIC_OR_DEPARTMENT_OTHER)
Admission: EM | Admit: 2021-09-16 | Discharge: 2021-09-17 | Disposition: A | Discharge status: Home / Self Care | Payer: Medicaid Other | Attending: Emergency Medicine | Admitting: Emergency Medicine

## 2021-09-16 ENCOUNTER — Emergency Department (HOSPITAL_BASED_OUTPATIENT_CLINIC_OR_DEPARTMENT_OTHER): Payer: Medicaid Other | Admitting: Radiology

## 2021-09-16 ENCOUNTER — Encounter (HOSPITAL_BASED_OUTPATIENT_CLINIC_OR_DEPARTMENT_OTHER): Payer: Self-pay

## 2021-09-16 DIAGNOSIS — R059 Cough, unspecified: Secondary | ICD-10-CM

## 2021-09-16 DIAGNOSIS — R042 Hemoptysis: Secondary | ICD-10-CM | POA: Insufficient documentation

## 2021-09-16 DIAGNOSIS — R509 Fever, unspecified: Secondary | ICD-10-CM | POA: Insufficient documentation

## 2021-09-16 DIAGNOSIS — Z20822 Contact with and (suspected) exposure to covid-19: Secondary | ICD-10-CM | POA: Insufficient documentation

## 2021-09-16 MED ORDER — BENZONATATE 100 MG PO CAPS
200.0000 mg | ORAL_CAPSULE | Freq: Once | ORAL | Status: AC
  Administered 2021-09-17: 200 mg via ORAL
  Filled 2021-09-16: qty 2

## 2021-09-16 NOTE — ED Triage Notes (Signed)
Pt presents to the ED with a cough x4 days. States that he was seen 3 days ago for this cough and received a negative covid and strep swab. States that he ran a fever of 101 two days ago. Pt A&Ox4 at time of triage. VSS. Does report coughing up blood.

## 2021-09-17 ENCOUNTER — Other Ambulatory Visit (HOSPITAL_BASED_OUTPATIENT_CLINIC_OR_DEPARTMENT_OTHER): Payer: Self-pay

## 2021-09-17 ENCOUNTER — Emergency Department (HOSPITAL_BASED_OUTPATIENT_CLINIC_OR_DEPARTMENT_OTHER): Payer: Medicaid Other

## 2021-09-17 ENCOUNTER — Encounter (HOSPITAL_BASED_OUTPATIENT_CLINIC_OR_DEPARTMENT_OTHER): Payer: Self-pay | Admitting: Emergency Medicine

## 2021-09-17 LAB — CBC WITH DIFFERENTIAL/PLATELET
Abs Immature Granulocytes: 0.01 10*3/uL (ref 0.00–0.07)
Basophils Absolute: 0.1 10*3/uL (ref 0.0–0.1)
Basophils Relative: 1 %
Eosinophils Absolute: 0.2 10*3/uL (ref 0.0–0.5)
Eosinophils Relative: 3 %
HCT: 45.8 % (ref 39.0–52.0)
Hemoglobin: 14.8 g/dL (ref 13.0–17.0)
Immature Granulocytes: 0 %
Lymphocytes Relative: 42 %
Lymphs Abs: 2.8 10*3/uL (ref 0.7–4.0)
MCH: 28 pg (ref 26.0–34.0)
MCHC: 32.3 g/dL (ref 30.0–36.0)
MCV: 86.6 fL (ref 80.0–100.0)
Monocytes Absolute: 0.6 10*3/uL (ref 0.1–1.0)
Monocytes Relative: 9 %
Neutro Abs: 3 10*3/uL (ref 1.7–7.7)
Neutrophils Relative %: 45 %
Platelets: 293 10*3/uL (ref 150–400)
RBC: 5.29 MIL/uL (ref 4.22–5.81)
RDW: 14.7 % (ref 11.5–15.5)
WBC: 6.6 10*3/uL (ref 4.0–10.5)
nRBC: 0 % (ref 0.0–0.2)

## 2021-09-17 LAB — BASIC METABOLIC PANEL
Anion gap: 7 (ref 5–15)
BUN: 13 mg/dL (ref 6–20)
CO2: 25 mmol/L (ref 22–32)
Calcium: 9.3 mg/dL (ref 8.9–10.3)
Chloride: 105 mmol/L (ref 98–111)
Creatinine, Ser: 0.8 mg/dL (ref 0.61–1.24)
GFR, Estimated: >60 mL/min (ref >60)
Glucose, Bld: 93 mg/dL (ref 70–99)
Potassium: 4.3 mmol/L (ref 3.5–5.1)
Sodium: 137 mmol/L (ref 135–145)

## 2021-09-17 LAB — RESP PANEL BY RT-PCR (FLU A&B, COVID) ARPGX2
Influenza A by PCR: NEGATIVE
Influenza B by PCR: NEGATIVE
SARS Coronavirus 2 by RT PCR: NEGATIVE

## 2021-09-17 MED ORDER — IOHEXOL 350 MG/ML SOLN
100.0000 mL | Freq: Once | INTRAVENOUS | Status: AC | PRN
  Administered 2021-09-17: 73 mL via INTRAVENOUS

## 2021-09-17 MED ORDER — BENZONATATE 100 MG PO CAPS
100.0000 mg | ORAL_CAPSULE | Freq: Three times a day (TID) | ORAL | 0 refills | Status: AC
  Filled 2021-09-17: qty 21, 7d supply, fill #0

## 2021-09-17 NOTE — ED Notes (Signed)
Pt verbalizes understanding of discharge instructions. Opportunity for questioning and answers were provided. Pt discharged from ED to home.   ? ?

## 2021-09-17 NOTE — ED Provider Notes (Signed)
[facility] EMERGENCY DEPT Provider Note   CSN: [id number] Arrival date & time: 09/16/21  2300     History  Chief Complaint  Patient presents with   Cough    Jordan Grimes is a 31 y.o. male.  The history is provided by the patient.  Cough Cough characteristics:  Non-productive Sputum characteristics: occasional blood. Severity:  Moderate Onset quality:  Gradual Duration:  2 weeks Timing:  Intermittent Progression:  Unchanged Chronicity:  New Context: not with activity   Relieved by:  Nothing Worsened by:  Nothing Ineffective treatments: cough syrup. Associated symptoms: fever   Associated symptoms: no chest pain and no shortness of breath   Associated symptoms comment:  Reportedly had a fever 2 days ago.  Seen by [facility] last week and had negative strep and covid Risk factors: no chemical exposure       Home Medications Prior to Admission medications   Medication Sig Start Date End Date Taking? Authorizing Provider  albuterol (PROVENTIL HFA;VENTOLIN HFA) 108 (90 Base) MCG/ACT inhaler Inhale 2 puffs into the lungs every 4 (four) hours as needed for wheezing or shortness of breath. 06/29/18   Joy, Shawn C, PA-C  amphetamine-dextroamphetamine (ADDERALL XR) 10 MG 24 hr capsule Take 10 mg by mouth every morning. 03/23/21   [provider]  azithromycin (ZITHROMAX Z-PAK) 250 MG tablet 2 pills today then 1 pill a day for 4 days Patient not taking: Reported on 04/16/2021 11/24/20   Placido Sou, PA-C  Bictegravir-Emtricitab-Tenofov (BIKTARVY PO) Take by mouth. Patient not taking: Reported on 04/16/2021    [provider]  dicyclomine (BENTYL) 20 MG tablet Take 1 tablet (20 mg total) by mouth 2 (two) times daily. 04/16/21   Charlie Char, MD  doravirin-lamivudin-tenofov df (DELSTRIGO) 100-300-300 MG TABS per tablet Take 1 tablet by mouth daily. 03/22/21   [provider]  losartan (COZAAR) 25 MG tablet Take 25 mg by mouth daily.     [provider]  methylPREDNISolone (MEDROL DOSEPAK) 4 MG TBPK tablet Take 6 pills on day one then decrease by 1 pill each day Patient not taking: Reported on 04/16/2021 08/15/20   Faythe Ghee, PA-C  naproxen (NAPROSYN) 500 MG tablet Take 1 tablet (500 mg total) by mouth 2 (two) times daily. 10/18/19   Horton, Mayer Masker, MD  omeprazole (PRILOSEC) 20 MG capsule Take 1 capsule (20 mg total) by mouth daily. 04/16/21   Kyllian Clingerman, MD      Allergies    Patient has no known allergies.    Review of Systems   Review of Systems  Constitutional:  Positive for fever.  HENT:  Negative for drooling and facial swelling.   Eyes:  Negative for redness.  Respiratory:  Positive for cough. Negative for shortness of breath.   Cardiovascular:  Negative for chest pain, palpitations and leg swelling.  Gastrointestinal:  Negative for abdominal pain.  Genitourinary:  Negative for difficulty urinating.  Musculoskeletal:  Negative for back pain.  Neurological:  Negative for facial asymmetry.  Psychiatric/Behavioral:  Negative for agitation.   All other systems reviewed and are negative.  Physical Exam Updated Vital Signs BP (!) 150/102    Pulse 83    Temp 98.8 F (37.1 C) (Oral)    Resp 14    Ht 6\' 1"  (1.854 m)    Wt 119.7 kg    SpO2 99%    BMI 34.83 kg/m  Physical Exam Vitals and nursing note reviewed.  Constitutional:      General: He  is not in acute distress.    Appearance: Normal appearance.  HENT:     Head: Normocephalic and atraumatic.     Nose: Nose normal.  Eyes:     Pupils: Pupils are equal, round, and reactive to light.  Cardiovascular:     Rate and Rhythm: Normal rate and regular rhythm.     Pulses: Normal pulses.     Heart sounds: Normal heart sounds.  Pulmonary:     Effort: Pulmonary effort is normal. No respiratory distress.     Breath sounds: Normal breath sounds. No wheezing or rales.  Abdominal:     General: Abdomen is flat. Bowel sounds are normal.     Palpations:  Abdomen is soft.     Tenderness: There is no abdominal tenderness. There is no guarding.  Musculoskeletal:        General: Normal range of motion.     Cervical back: Normal range of motion and neck supple.  Skin:    General: Skin is warm and dry.     Capillary Refill: Capillary refill takes less than 2 seconds.  Neurological:     General: No focal deficit present.     Mental Status: He is alert and oriented to person, place, and time.     Deep Tendon Reflexes: Reflexes normal.  Psychiatric:        Mood and Affect: Mood normal.        Behavior: Behavior normal.    ED Results / Procedures / Treatments   Labs (all labs ordered are listed, but only abnormal results are displayed) Labs Reviewed  RESP PANEL BY RT-PCR (FLU A&B, COVID) ARPGX2  CBC WITH DIFFERENTIAL/PLATELET  BASIC METABOLIC PANEL    EKG EKG Interpretation  Date/Time:  Sunday September 16 2021 23:21:37 EST Ventricular Rate:  90 PR Interval:  191 QRS Duration: 98 QT Interval:  354 QTC Calculation: 434 R Axis:   -66 Text Interpretation: Sinus rhythm Confirmed by Jamoni Hewes ([id number]) on 09/16/2021 11:39:39 PM  Radiology DG Chest 2 View  Result Date: 09/16/2021 CLINICAL DATA:  Cough EXAM: CHEST - 2 VIEW COMPARISON:  11/24/2020 FINDINGS: Lungs are clear.  No pleural effusion or pneumothorax. The heart is normal in size. Visualized osseous structures are within normal limits. IMPRESSION: Normal chest radiographs. Electronically Signed   By: Charline Bills M.D.   On: 09/16/2021 23:30   CT Angio Chest PE W and/or Wo Contrast  Result Date: 09/17/2021 CLINICAL DATA:  Cough EXAM: CT ANGIOGRAPHY CHEST WITH CONTRAST TECHNIQUE: Multidetector CT imaging of the chest was performed using the standard protocol during bolus administration of intravenous contrast. Multiplanar CT image reconstructions and MIPs were obtained to evaluate the vascular anatomy. RADIATION DOSE REDUCTION: This exam was performed according to the  departmental dose-optimization program which includes automated exposure control, adjustment of the mA and/or kV according to patient size and/or use of iterative reconstruction technique. CONTRAST:  41mL OMNIPAQUE IOHEXOL 350 MG/ML SOLN COMPARISON:  Chest radiograph dated 09/16/2021 FINDINGS: Cardiovascular: Satisfactory opacification the bilateral pulmonary arteries to the segmental level. No evidence of pulmonary embolism. Although not tailored for evaluation of the thoracic aorta, there is no evidence of thoracic aortic aneurysm or dissection. The heart is normal in size.  No pericardial effusion. Mediastinum/Nodes: Residual thymic tissue along the anterior mediastinum. No suspicious mediastinal lymphadenopathy. Visualized thyroid is unremarkable. Lungs/Pleura: No suspicious pulmonary nodules. No focal consolidation. No pleural effusion or pneumothorax. Upper Abdomen: Visualized upper abdomen is grossly unremarkable. Musculoskeletal: Visualized osseous structures are within normal limits.  Review of the MIP images confirms the above findings. IMPRESSION: No evidence of pulmonary embolism. Negative CT chest. Electronically Signed   By: Charline BillsSriyesh  Krishnan M.D.   On: 09/17/2021 00:32    Procedures Procedures   Medications Ordered in ED Medications  benzonatate (TESSALON) capsule 200 mg (200 mg Oral Given 09/17/21 0002)  iohexol (OMNIPAQUE) 350 MG/ML injection 100 mL (73 mLs Intravenous Contrast Given 09/17/21 0010)    ED Course/ Medical Decision Making/ A&P                           Medical Decision Making Cough with occasional blood.  Fever several days ago, not since.  Cough syrup has not cured it   Amount and/or Complexity of Data Reviewed External Data Reviewed: labs and notes.    [facility] Labs: ordered.    Details: Normal CBC normal differential, normal electrolytes Radiology: ordered.    Details: Normal CTA by me agree with radiology ECG/medicine tests: ordered.  Decision-making details documented in ED Course.  Risk Prescription drug management. Risk Details: Patient with cough.  Negative exam with normal vital signs, labs and imaging.  Ruled out for PE in the ED.  No covid or flu.  I do not believe the patient requires hospitalization as he is well appearing with normal work up in the ED.  No indication for antibiotics as there is no bacterial infection.  RX for tessalon.  Follow up with your ID physician    Final Clinical Impression(s) / ED Diagnoses Final diagnoses:  None    Return for intractable cough, coughing up blood, fevers > 100.4 unrelieved by medication, shortness of breath, intractable vomiting, chest pain, shortness of breath, weakness, numbness, changes in speech, facial asymmetry, abdominal pain, passing out, Inability to tolerate liquids or food, cough, altered mental status or any concerns. No signs of systemic illness or infection. The patient is nontoxic-appearing on exam and vital signs are within normal limits.  I have reviewed the triage vital signs and the nursing notes. Pertinent labs & imaging results that were available during my care of the patient were reviewed by me and considered in my medical decision making (see chart for details). After history, exam, and medical workup I feel the patient has been appropriately medically screened and is safe for discharge home. Pertinent diagnoses were discussed with the patient. Patient was given return precautions.           Sharena Dibenedetto, MD 09/17/21 [id number]

## 2021-09-27 MED ORDER — DEXTROAMPHETAMINE-AMPHETAMINE 10 MG TABLET
ORAL_TABLET | Freq: Every day | ORAL | 0 refills | 30.00000 days | Status: CP
Start: 2021-09-27 — End: 2021-10-26

## 2021-09-27 MED ORDER — DEXTROAMPHETAMINE-AMPHETAMINE ER 10 MG 24HR CAPSULE,EXTEND RELEASE
ORAL_CAPSULE | Freq: Every morning | ORAL | 0 refills | 30.00000 days | Status: CP
Start: 2021-09-27 — End: 2021-10-26

## 2021-10-26 MED ORDER — DEXTROAMPHETAMINE-AMPHETAMINE ER 10 MG 24HR CAPSULE,EXTEND RELEASE
ORAL_CAPSULE | Freq: Every morning | ORAL | 0 refills | 30 days | Status: CP
Start: 2021-10-26 — End: 2021-11-24

## 2021-10-26 MED ORDER — DEXTROAMPHETAMINE-AMPHETAMINE 10 MG TABLET
ORAL_TABLET | Freq: Every day | ORAL | 0 refills | 30 days | Status: CP
Start: 2021-10-26 — End: 2021-11-24

## 2021-11-24 MED ORDER — DEXTROAMPHETAMINE-AMPHETAMINE 10 MG TABLET
ORAL_TABLET | Freq: Every day | ORAL | 0 refills | 30.00000 days | Status: CP
Start: 2021-11-24 — End: 2021-12-23

## 2021-11-24 MED ORDER — DEXTROAMPHETAMINE-AMPHETAMINE ER 10 MG 24HR CAPSULE,EXTEND RELEASE
ORAL_CAPSULE | Freq: Every morning | ORAL | 0 refills | 30.00000 days | Status: CP
Start: 2021-11-24 — End: 2021-12-23

## 2021-12-12 ENCOUNTER — Ambulatory Visit: Admit: 2021-12-12 | Discharge: 2021-12-13

## 2021-12-12 DIAGNOSIS — R300 Dysuria: Principal | ICD-10-CM

## 2022-01-17 ENCOUNTER — Ambulatory Visit: Admit: 2022-01-17 | Discharge: 2022-01-18 | Attending: Family | Primary: Family

## 2022-01-17 DIAGNOSIS — R059 Cough, unspecified type: Principal | ICD-10-CM

## 2022-01-17 DIAGNOSIS — J45909 Unspecified asthma, uncomplicated: Principal | ICD-10-CM

## 2022-01-17 DIAGNOSIS — Z113 Encounter for screening for infections with a predominantly sexual mode of transmission: Principal | ICD-10-CM

## 2022-01-17 DIAGNOSIS — Z0000 Encounter for general adult medical examination without abnormal findings: Principal | ICD-10-CM

## 2022-01-17 DIAGNOSIS — B20 Human immunodeficiency virus [HIV] disease: Principal | ICD-10-CM

## 2022-01-17 MED ORDER — ALBUTEROL SULFATE HFA 90 MCG/ACTUATION AEROSOL INHALER
Freq: Four times a day (QID) | RESPIRATORY_TRACT | 1 refills | 0 days | Status: CP | PRN
Start: 2022-01-17 — End: ?

## 2022-01-17 MED ORDER — DELSTRIGO 100 MG-300 MG-300 MG TABLET
ORAL_TABLET | Freq: Every day | ORAL | 11 refills | 0 days | Status: CP
Start: 2022-01-17 — End: ?

## 2022-03-27 MED ORDER — DEXTROAMPHETAMINE-AMPHETAMINE 10 MG TABLET
ORAL_TABLET | 0 refills | 0 days | Status: CP
Start: 2022-03-27 — End: ?

## 2022-04-02 ENCOUNTER — Ambulatory Visit: Admit: 2022-04-02 | Discharge: 2022-04-03 | Attending: Family | Primary: Family

## 2022-04-02 DIAGNOSIS — Z0000 Encounter for general adult medical examination without abnormal findings: Principal | ICD-10-CM

## 2022-04-02 DIAGNOSIS — Z202 Contact with and (suspected) exposure to infections with a predominantly sexual mode of transmission: Principal | ICD-10-CM

## 2022-04-02 DIAGNOSIS — Z113 Encounter for screening for infections with a predominantly sexual mode of transmission: Principal | ICD-10-CM

## 2022-04-02 DIAGNOSIS — B20 Human immunodeficiency virus [HIV] disease: Principal | ICD-10-CM

## 2022-04-02 DIAGNOSIS — Z23 Encounter for immunization: Principal | ICD-10-CM

## 2022-04-02 DIAGNOSIS — R300 Dysuria: Principal | ICD-10-CM

## 2022-04-02 MED ORDER — DEXTROAMPHETAMINE-AMPHETAMINE ER 10 MG 24HR CAPSULE,EXTEND RELEASE
ORAL_CAPSULE | Freq: Every morning | ORAL | 0 refills | 30 days | Status: CP
Start: 2022-04-02 — End: ?

## 2022-04-02 MED ORDER — DOXYCYCLINE MONOHYDRATE 100 MG CAPSULE
ORAL_CAPSULE | Freq: Two times a day (BID) | ORAL | 0 refills | 14 days | Status: CP
Start: 2022-04-02 — End: 2022-04-16
  Filled 2022-04-02: qty 28, 14d supply, fill #0

## 2022-04-13 ENCOUNTER — Telehealth: Admit: 2022-04-13 | Discharge: 2022-04-14 | Payer: MEDICAID

## 2022-04-13 DIAGNOSIS — U071 COVID-19: Principal | ICD-10-CM

## 2022-04-13 MED ORDER — PAXLOVID 300 MG (150 MG X 2)-100 MG TABLETS IN A DOSE PACK
ORAL_TABLET | 0 refills | 0 days | Status: CP
Start: 2022-04-13 — End: ?

## 2022-04-18 ENCOUNTER — Telehealth
Admit: 2022-04-18 | Discharge: 2022-04-19 | Attending: Student in an Organized Health Care Education/Training Program | Primary: Student in an Organized Health Care Education/Training Program

## 2022-04-18 DIAGNOSIS — F900 Attention-deficit hyperactivity disorder, predominantly inattentive type: Principal | ICD-10-CM

## 2022-04-18 DIAGNOSIS — F4310 Post-traumatic stress disorder, unspecified: Principal | ICD-10-CM

## 2022-04-18 MED ORDER — DEXTROAMPHETAMINE-AMPHETAMINE 10 MG TABLET
ORAL_TABLET | Freq: Every day | ORAL | 0 refills | 30 days | Status: CP | PRN
Start: 2022-04-18 — End: 2022-05-18

## 2022-04-18 MED ORDER — DEXTROAMPHETAMINE-AMPHETAMINE ER 20 MG 24HR CAPSULE,EXTEND RELEASE
ORAL_CAPSULE | Freq: Every morning | ORAL | 0 refills | 30 days | Status: CP
Start: 2022-04-18 — End: 2022-05-18

## 2022-04-25 DIAGNOSIS — J45909 Unspecified asthma, uncomplicated: Principal | ICD-10-CM

## 2022-04-26 MED ORDER — ALBUTEROL SULFATE HFA 90 MCG/ACTUATION AEROSOL INHALER
Freq: Four times a day (QID) | RESPIRATORY_TRACT | 0 refills | 0 days | Status: CP | PRN
Start: 2022-04-26 — End: ?

## 2022-05-18 MED ORDER — DEXTROAMPHETAMINE-AMPHETAMINE 10 MG TABLET
ORAL_TABLET | Freq: Every day | ORAL | 0 refills | 30 days | Status: CP | PRN
Start: 2022-05-18 — End: 2022-06-17

## 2022-05-18 MED ORDER — DEXTROAMPHETAMINE-AMPHETAMINE ER 20 MG 24HR CAPSULE,EXTEND RELEASE
ORAL_CAPSULE | Freq: Every morning | ORAL | 0 refills | 30 days | Status: CP
Start: 2022-05-18 — End: 2022-06-17

## 2022-06-06 ENCOUNTER — Institutional Professional Consult (permissible substitution): Admit: 2022-06-06 | Discharge: 2022-06-07

## 2022-06-06 DIAGNOSIS — Z202 Contact with and (suspected) exposure to infections with a predominantly sexual mode of transmission: Principal | ICD-10-CM

## 2022-06-06 MED ORDER — DOXYCYCLINE MONOHYDRATE 100 MG CAPSULE
ORAL_CAPSULE | Freq: Two times a day (BID) | ORAL | 0 refills | 7 days | Status: CP
Start: 2022-06-06 — End: 2022-06-13
  Filled 2022-06-06: qty 14, 7d supply, fill #0

## 2022-06-17 MED ORDER — DEXTROAMPHETAMINE-AMPHETAMINE ER 20 MG 24HR CAPSULE,EXTEND RELEASE
ORAL_CAPSULE | Freq: Every morning | ORAL | 0 refills | 30 days | Status: CP
Start: 2022-06-17 — End: 2022-07-17

## 2022-06-17 MED ORDER — DEXTROAMPHETAMINE-AMPHETAMINE 10 MG TABLET
ORAL_TABLET | Freq: Every day | ORAL | 0 refills | 30 days | Status: CP | PRN
Start: 2022-06-17 — End: 2022-07-17

## 2022-07-18 ENCOUNTER — Ambulatory Visit: Admit: 2022-07-18 | Discharge: 2022-07-18 | Attending: Family | Primary: Family

## 2022-07-18 ENCOUNTER — Telehealth
Admit: 2022-07-18 | Discharge: 2022-07-18 | Attending: Student in an Organized Health Care Education/Training Program | Primary: Student in an Organized Health Care Education/Training Program

## 2022-07-18 DIAGNOSIS — Z113 Encounter for screening for infections with a predominantly sexual mode of transmission: Principal | ICD-10-CM

## 2022-07-18 DIAGNOSIS — Z9189 Other specified personal risk factors, not elsewhere classified: Principal | ICD-10-CM

## 2022-07-18 DIAGNOSIS — F4310 Post-traumatic stress disorder, unspecified: Principal | ICD-10-CM

## 2022-07-18 DIAGNOSIS — Z5181 Encounter for therapeutic drug level monitoring: Principal | ICD-10-CM

## 2022-07-18 DIAGNOSIS — Z1212 Encounter for screening for malignant neoplasm of rectum: Principal | ICD-10-CM

## 2022-07-18 DIAGNOSIS — B20 Human immunodeficiency virus [HIV] disease: Principal | ICD-10-CM

## 2022-07-18 DIAGNOSIS — Z79899 Other long term (current) drug therapy: Principal | ICD-10-CM

## 2022-07-18 DIAGNOSIS — F900 Attention-deficit hyperactivity disorder, predominantly inattentive type: Principal | ICD-10-CM

## 2022-07-18 DIAGNOSIS — Z1151 Encounter for screening for human papillomavirus (HPV): Principal | ICD-10-CM

## 2022-07-18 MED ORDER — DEXTROAMPHETAMINE-AMPHETAMINE ER 20 MG 24HR CAPSULE,EXTEND RELEASE
ORAL_CAPSULE | Freq: Every morning | ORAL | 0 refills | 30 days | Status: CP
Start: 2022-07-18 — End: ?

## 2022-07-18 MED ORDER — DELSTRIGO 100 MG-300 MG-300 MG TABLET
ORAL_TABLET | Freq: Every day | ORAL | 11 refills | 0 days | Status: CP
Start: 2022-07-18 — End: ?

## 2022-07-18 MED ORDER — DEXTROAMPHETAMINE-AMPHETAMINE 10 MG TABLET
ORAL_TABLET | Freq: Every day | ORAL | 0 refills | 30 days | Status: CP | PRN
Start: 2022-07-18 — End: ?

## 2022-08-17 MED ORDER — DEXTROAMPHETAMINE-AMPHETAMINE ER 20 MG 24HR CAPSULE,EXTEND RELEASE
ORAL_CAPSULE | Freq: Every morning | ORAL | 0 refills | 30 days | Status: CP
Start: 2022-08-17 — End: ?

## 2022-08-17 MED ORDER — DEXTROAMPHETAMINE-AMPHETAMINE 10 MG TABLET
ORAL_TABLET | Freq: Every day | ORAL | 0 refills | 30 days | Status: CP | PRN
Start: 2022-08-17 — End: ?

## 2022-09-16 MED ORDER — DEXTROAMPHETAMINE-AMPHETAMINE ER 20 MG 24HR CAPSULE,EXTEND RELEASE
ORAL_CAPSULE | Freq: Every morning | ORAL | 0 refills | 30 days | Status: CP
Start: 2022-09-16 — End: ?

## 2022-09-16 MED ORDER — DEXTROAMPHETAMINE-AMPHETAMINE 10 MG TABLET
ORAL_TABLET | Freq: Every day | ORAL | 0 refills | 60 days | Status: CP | PRN
Start: 2022-09-16 — End: ?

## 2022-10-14 MED ORDER — DEXTROAMPHETAMINE-AMPHETAMINE 10 MG TABLET
ORAL_TABLET | Freq: Every day | ORAL | 0 refills | 30 days | Status: CP | PRN
Start: 2022-10-14 — End: 2022-11-13

## 2022-10-14 MED ORDER — DEXTROAMPHETAMINE-AMPHETAMINE ER 20 MG 24HR CAPSULE,EXTEND RELEASE
ORAL_CAPSULE | Freq: Every morning | ORAL | 0 refills | 30 days | Status: CP
Start: 2022-10-14 — End: ?

## 2022-10-22 ENCOUNTER — Ambulatory Visit: Admit: 2022-10-22 | Discharge: 2022-10-22

## 2022-10-22 ENCOUNTER — Ambulatory Visit: Admit: 2022-10-22 | Discharge: 2022-10-22 | Attending: Family | Primary: Family

## 2022-10-22 DIAGNOSIS — Z113 Encounter for screening for infections with a predominantly sexual mode of transmission: Principal | ICD-10-CM

## 2022-10-22 DIAGNOSIS — R6882 Decreased libido: Principal | ICD-10-CM

## 2022-10-22 DIAGNOSIS — Z9189 Other specified personal risk factors, not elsewhere classified: Principal | ICD-10-CM

## 2022-10-22 DIAGNOSIS — Z23 Encounter for immunization: Principal | ICD-10-CM

## 2022-10-22 DIAGNOSIS — Z13220 Encounter for screening for lipoid disorders: Principal | ICD-10-CM

## 2022-10-22 DIAGNOSIS — Z79899 Other long term (current) drug therapy: Principal | ICD-10-CM

## 2022-10-22 DIAGNOSIS — Z202 Contact with and (suspected) exposure to infections with a predominantly sexual mode of transmission: Principal | ICD-10-CM

## 2022-10-22 DIAGNOSIS — B20 Human immunodeficiency virus [HIV] disease: Principal | ICD-10-CM

## 2022-10-22 DIAGNOSIS — Z5181 Encounter for therapeutic drug level monitoring: Principal | ICD-10-CM

## 2022-10-22 MED ORDER — DOXYCYCLINE MONOHYDRATE 100 MG CAPSULE
ORAL_CAPSULE | Freq: Once | ORAL | 0 refills | 0 days | Status: CP | PRN
Start: 2022-10-22 — End: ?
  Filled 2022-10-22: qty 30, 15d supply, fill #0

## 2022-10-22 MED ORDER — DELSTRIGO 100 MG-300 MG-300 MG TABLET
ORAL_TABLET | Freq: Every day | ORAL | 0 refills | 0 days | Status: CP
Start: 2022-10-22 — End: ?

## 2022-10-24 ENCOUNTER — Telehealth
Admit: 2022-10-24 | Discharge: 2022-10-25 | Attending: Student in an Organized Health Care Education/Training Program | Primary: Student in an Organized Health Care Education/Training Program

## 2022-10-24 DIAGNOSIS — F900 Attention-deficit hyperactivity disorder, predominantly inattentive type: Principal | ICD-10-CM

## 2022-10-24 DIAGNOSIS — F4310 Post-traumatic stress disorder, unspecified: Principal | ICD-10-CM

## 2022-10-31 MED ORDER — DEXTROAMPHETAMINE-AMPHETAMINE ER 20 MG 24HR CAPSULE,EXTEND RELEASE
ORAL_CAPSULE | Freq: Every morning | ORAL | 0 refills | 30 days | Status: CP
Start: 2022-10-31 — End: ?

## 2022-10-31 MED ORDER — DEXTROAMPHETAMINE-AMPHETAMINE 10 MG TABLET
ORAL_TABLET | Freq: Every day | ORAL | 0 refills | 30 days | Status: CP | PRN
Start: 2022-10-31 — End: ?

## 2022-11-21 ENCOUNTER — Emergency Department (HOSPITAL_BASED_OUTPATIENT_CLINIC_OR_DEPARTMENT_OTHER)
Admission: EM | Admit: 2022-11-21 | Discharge: 2022-11-21 | Disposition: A | Discharge status: Home / Self Care | Payer: BC Managed Care – PPO | Attending: Emergency Medicine | Admitting: Emergency Medicine

## 2022-11-21 ENCOUNTER — Encounter (HOSPITAL_BASED_OUTPATIENT_CLINIC_OR_DEPARTMENT_OTHER): Payer: Self-pay | Admitting: *Deleted

## 2022-11-21 ENCOUNTER — Other Ambulatory Visit: Payer: Self-pay

## 2022-11-21 DIAGNOSIS — R509 Fever, unspecified: Secondary | ICD-10-CM | POA: Diagnosis present

## 2022-11-21 DIAGNOSIS — J069 Acute upper respiratory infection, unspecified: Secondary | ICD-10-CM | POA: Insufficient documentation

## 2022-11-21 DIAGNOSIS — I10 Essential (primary) hypertension: Secondary | ICD-10-CM | POA: Diagnosis not present

## 2022-11-21 DIAGNOSIS — Z79899 Other long term (current) drug therapy: Secondary | ICD-10-CM | POA: Insufficient documentation

## 2022-11-21 DIAGNOSIS — Z1152 Encounter for screening for COVID-19: Secondary | ICD-10-CM | POA: Insufficient documentation

## 2022-11-21 LAB — RESP PANEL BY RT-PCR (RSV, FLU A&B, COVID)  RVPGX2
Influenza A by PCR: NEGATIVE
Influenza B by PCR: NEGATIVE
Resp Syncytial Virus by PCR: NEGATIVE
SARS Coronavirus 2 by RT PCR: NEGATIVE

## 2022-11-21 MED ORDER — ONDANSETRON HCL 4 MG PO TABS: 4.0000 mg | ORAL_TABLET | Freq: Three times a day (TID) | ORAL | 0 refills | Status: AC | PRN

## 2022-11-21 MED ORDER — IBUPROFEN 800 MG PO TABS
800.0000 mg | ORAL_TABLET | Freq: Once | ORAL | Status: AC
  Administered 2022-11-21: 800 mg via ORAL
  Filled 2022-11-21: qty 1

## 2022-11-21 NOTE — ED Provider Notes (Signed)
[facility] [place] Provider Note   CSN: [id number] Arrival date & time: 11/21/22  1841     History  Chief Complaint  Patient presents with   Illness    Jordan Grimes is a 32 y.o. male.  Patient presents the emergency department complaining of bodyaches, chills, fever, chest congestion which began yesterday.  Patient also endorses nausea.  He denies shortness of breath, chest pain, cough, abdominal pain.  Patient states he took Alka-Seltzer cold medicine this morning with little relief.  Past medical history significant for hypertension  HPI     Home Medications Prior to Admission medications   Medication Sig Start Date End Date Taking? Authorizing Provider  ondansetron (ZOFRAN) 4 MG tablet Take 1 tablet (4 mg total) by mouth every 8 (eight) hours as needed for nausea or vomiting. 11/21/22  Yes Dorothyann Peng, PA-C  albuterol (PROVENTIL HFA;VENTOLIN HFA) 108 (90 Base) MCG/ACT inhaler Inhale 2 puffs into the lungs every 4 (four) hours as needed for wheezing or shortness of breath. 06/29/18   Joy, Shawn C, PA-C  amphetamine-dextroamphetamine (ADDERALL XR) 10 MG 24 hr capsule Take 10 mg by mouth every morning. 03/23/21   [provider]  azithromycin (ZITHROMAX Z-PAK) 250 MG tablet 2 pills today then 1 pill a day for 4 days Patient not taking: Reported on 04/16/2021 11/24/20   Rayna Sexton, PA-C  benzonatate (TESSALON) 100 MG capsule Take 1 capsule (100 mg total) by mouth every 8 (eight) hours. 09/17/21   Palumbo, April, MD  Bictegravir-Emtricitab-Tenofov (BIKTARVY PO) Take by mouth. Patient not taking: Reported on 04/16/2021    [provider]  dicyclomine (BENTYL) 20 MG tablet Take 1 tablet (20 mg total) by mouth 2 (two) times daily. 04/16/21   Palumbo, April, MD  doravirin-lamivudin-tenofov df (DELSTRIGO) 100-300-300 MG TABS per tablet Take 1 tablet by mouth daily. 03/22/21   [provider]  losartan (COZAAR) 25 MG  tablet Take 25 mg by mouth daily.    [provider]  methylPREDNISolone (MEDROL DOSEPAK) 4 MG TBPK tablet Take 6 pills on day one then decrease by 1 pill each day Patient not taking: Reported on 04/16/2021 08/15/20   Versie Starks, PA-C  naproxen (NAPROSYN) 500 MG tablet Take 1 tablet (500 mg total) by mouth 2 (two) times daily. 10/18/19   Horton, Barbette Hair, MD  omeprazole (PRILOSEC) 20 MG capsule Take 1 capsule (20 mg total) by mouth daily. 04/16/21   Palumbo, April, MD      Allergies    Patient has no known allergies.    Review of Systems   Review of Systems  Physical Exam Updated Vital Signs BP (!) 144/81 (BP Location: Right Arm)   Pulse (!) 107   Temp (!) 100.6 F (38.1 C) (Oral)   Resp 18   SpO2 98%  Physical Exam Vitals and nursing note reviewed.  Constitutional:      General: He is not in acute distress.    Appearance: He is well-developed.  HENT:     Head: Normocephalic and atraumatic.  Eyes:     Conjunctiva/sclera: Conjunctivae normal.  Cardiovascular:     Rate and Rhythm: Normal rate and regular rhythm.     Heart sounds: No murmur heard. Pulmonary:     Effort: Pulmonary effort is normal. No respiratory distress.     Breath sounds: Normal breath sounds.  Abdominal:     Palpations: Abdomen is soft.     Tenderness: There is no abdominal tenderness.  Musculoskeletal:        General: No swelling.     Cervical back: Neck supple.  Skin:    General: Skin is warm and dry.     Capillary Refill: Capillary refill takes less than 2 seconds.  Neurological:     Mental Status: He is alert.  Psychiatric:        Mood and Affect: Mood normal.     ED Results / Procedures / Treatments   Labs (all labs ordered are listed, but only abnormal results are displayed) Labs Reviewed  RESP PANEL BY RT-PCR (RSV, FLU A&B, COVID)  RVPGX2    EKG None  Radiology No results found.  Procedures Procedures    Medications Ordered in ED Medications  ibuprofen (ADVIL)  tablet 800 mg (800 mg Oral Given 11/21/22 1930)    ED Course/ Medical Decision Making/ A&P                             Medical Decision Making Risk Prescription drug management.   Patient presents with a chief complaint of upper respiratory symptoms.  Differential diagnosis includes but is not limited to COVID, influenza, RSV, other viral processes, and others  Patient is, but he is including hypertension  I ordered and reviewed labs.  The respiratory panel was negative  There is no indication at this time for imaging.  The patient's lungs are clear to auscultation bilaterally  I ordered the patient ibuprofen for fever and pain.  Upon reassessment he was doing better.  Patient symptoms are consistent with upper respiratory infection.  Plan to discharge patient home with prescription for Zofran for mild nausea and recommendations for over-the-counter medications such as Tylenol and ibuprofen for fever and pain control.  Return precautions provided.        Final Clinical Impression(s) / ED Diagnoses Final diagnoses:  Viral upper respiratory tract infection    Rx / DC Orders ED Discharge Orders          Ordered    ondansetron (ZOFRAN) 4 MG tablet  Every 8 hours PRN        11/21/22 2005              Ronny Bacon 11/21/22 2016    Tegeler, Gwenyth Allegra, MD 11/21/22 2033

## 2022-11-21 NOTE — ED Triage Notes (Signed)
Pt states that he slept all day yesterday and today he has body aches and chills with some chest congestion.

## 2022-11-21 NOTE — Discharge Instructions (Signed)
You were diagnosed today with an upper respiratory infection, likely viral.  I have prescribed Zofran for nausea to be taken as directed.  I recommend taking ibuprofen and acetaminophen as needed for fever and pain control.  You may take up to 600 mg of ibuprofen and 1000 mg of acetaminophen every 6 hours.  These may be alternated by taking the second medication 3 hours after the first medication.  If you develop any life-threatening symptoms please return to the emergency department.

## 2022-11-28 MED ORDER — DEXTROAMPHETAMINE-AMPHETAMINE ER 20 MG 24HR CAPSULE,EXTEND RELEASE
ORAL_CAPSULE | Freq: Every morning | ORAL | 0 refills | 30 days | Status: CP
Start: 2022-11-28 — End: ?

## 2022-11-28 MED ORDER — DEXTROAMPHETAMINE-AMPHETAMINE 10 MG TABLET
ORAL_TABLET | Freq: Every day | ORAL | 0 refills | 30 days | Status: CP | PRN
Start: 2022-11-28 — End: ?

## 2022-12-16 DIAGNOSIS — B20 Human immunodeficiency virus [HIV] disease: Principal | ICD-10-CM

## 2022-12-17 NOTE — Unmapped (Signed)
[facility] Specialty Medication Onboarding    Specialty Medication: DELSTRIGO 100-300-300 mg Tab (doravirine-lamivu-tenofov diso)  Prior Authorization: Not Required   Financial Assistance: Yes - copay card approved as secondary   Final Copay/Day Supply: $0 / 30    Insurance Restrictions: None     Notes to Pharmacist:     The triage team has completed the benefits investigation and has determined that the patient is able to fill this medication at [facility]. Please contact the patient to complete the onboarding or follow up with the prescribing physician as needed.

## 2022-12-18 NOTE — Unmapped (Incomplete)
***INCOMPLETE. WAITING FOR PATIENT TO [facility]   Patient Onboarding/Medication Counseling    Kerry Foley is a 32 y.o. male with HIV who I am counseling today on continuation of therapy.  I am speaking to the patient.    Was a Nurse, learning disability used for this call? No    Verified patient's date of birth / HIPAA.    Specialty medication(s) to be sent: Infectious Disease: Delstrigo      Non-specialty medications/supplies to be sent: n/a      Medications not needed at this time: n/a         Delstrigo (doravirine-lamivudine-tenofovir disoproxil fumarate 100-300-300mg )     Medication & Administration     Dosage: Take one tablet by mouth daily    Administration: Take with or without food     Adherence/Missed dose instructions: take missed dose as soon as you remember. If it is close to the time of your next dose, skip the dose and resume with your next scheduled dose.    Goals of Therapy     The goal is to suppress HIV viral replication to keep HIV levels non-detectable on lab tests    Side Effects & Monitoring Parameters   Upset stomach  Dizziness  Strange or odd dreams      The following side effects should be reported to the provider:  Signs of an allergic reaction, like rash; hives; itching; red, swollen, blistered, or peeling skin with or without fever; wheezing; tightness in the chest or throat; trouble breathing, swallowing, or talking; unusual hoarseness; or swelling of the mouth, face, lips, tongue, or throat.  Signs of kidney problems like unable to pass urine, change in how much urine is passed, blood in the urine, or a big weight gain.  Signs of liver problems like dark urine, feeling tired, not hungry, upset stomach or stomach pain, light-colored stools, throwing up, or yellow skin or eyes.  Signs of too much lactic acid in the blood (lactic acidosis) like fast breathing, fast heartbeat, a heartbeat that does not feel normal, very bad upset stomach or throwing up, feeling very sleepy, shortness of breath, feeling very tired or weak, very bad dizziness, feeling cold, or muscle pain or cramps.  Signs of a pancreas problem (pancreatitis) like very bad stomach pain, very bad back pain, or very bad upset stomach or throwing up.  Bone pain.  Muscle pain or weakness.  Pain in arms or legs.  Change in body fat.  Low mood (depression).  Changes in your immune system can happen when you start taking drugs to treat HIV. If you have an infection that you did not know you had, it may show up when you take this drug. Tell your doctor right away if you have any new signs after you start this drug, even after taking it for several months. This includes signs of infection like fever, sore throat, weakness, cough, or shortness of breath.    Monitoring Parameters:  CD4 count  Viral load  Liver function tests  Serum Creatinine  Urine glucose  Urine protein: prior to therapy and as clinically indicated during therapy  Serum phosphorous in patients with chronic kidney disease  Bone density: patients with a history of bone fracture or risk factors for bone loss  Hepatits B test prior to initiating therapy    Contraindications, Warnings, & Precautions     Hypersensitivity to any of its components  Concurrent administration of strong CYP3A inducers such as carbamazepine, oxcarbazepine,  phenobarbital, phenytoin, rifampin, and St. John's Wort    Drug/Food Interactions     Medication list reviewed in Epic. The patient was instructed to inform the care team before taking any new medications or supplements. {Blank single:19197::No drug interactions identified,***}.   ***     Storage, Handling Precautions, & Disposal     Store in the original container at room temperature.  Store in a dry place. Do not store in a bathroom.  Keep lid tightly closed.  Keep all drugs in a safe place. Keep all drugs out of the reach of children and pets.  Throw away unused or expired drugs. Do not flush down a toilet or pour down a drain unless you are told to do so. Check with your pharmacist if you have questions about the best way to throw out drugs. There may be drug take-back programs in your area.      Current Medications (including OTC/herbals), Comorbidities and Allergies     Current Outpatient Medications   Medication Sig Dispense Refill    albuterol HFA 90 mcg/actuation inhaler Inhale 2 puffs every six (6) hours as needed for wheezing. 8 g 0    dextroamphetamine-amphetamine (ADDERALL XR) 20 MG 24 hr capsule Take 1 capsule (20 mg total) by mouth every morning. 30 capsule 0    dextroamphetamine-amphetamine (ADDERALL XR) 20 MG 24 hr capsule Take 1 capsule (20 mg total) by mouth every morning. 30 capsule 0    [START ON 12/26/2022] dextroamphetamine-amphetamine (ADDERALL XR) 20 MG 24 hr capsule Take 1 capsule (20 mg total) by mouth every morning. 30 capsule 0    dextroamphetamine-amphetamine (ADDERALL) 10 mg tablet Take 1 tablet (10 mg total) by mouth daily as needed (inattention). 30 tablet 0    dextroamphetamine-amphetamine (ADDERALL) 10 mg tablet Take 1 tablet (10 mg total) by mouth daily as needed (inattention). 30 tablet 0    [START ON 12/26/2022] dextroamphetamine-amphetamine (ADDERALL) 10 mg tablet Take 1 tablet (10 mg total) by mouth daily as needed (inattention). 60 tablet 0    doravirine-lamivu-tenofov diso (DELSTRIGO) 100-300-300 mg Tab Take 1 tablet by mouth daily. 30 tablet 0    doxycycline (MONODOX) 100 MG capsule Take 2 capsules (200 mg total) by mouth once as needed (within 72 hours (3 days) after sex) for up to 1 dose, ideally within 24 hours. Take no more than 200 mg in a 24 hour period. 30 capsule 0    nirmatrelvir-ritonavir (PAXLOVID CO-PACK, EUA,) 300 mg (150 mg x 2)-100 mg tablet See package instructions. (Patient not taking: Reported on 10/24/2022) 30 tablet 0    valsartan (DIOVAN) 40 MG tablet Take 1 tablet (40 mg total) by mouth daily. (Patient not taking: Reported on 10/24/2022) 180 tablet 0     No current facility-administered medications for this visit.       No Known Allergies    Patient Active Problem List   Diagnosis    Closed dislocation of patella    Enlarged tonsils    HIV (human immunodeficiency virus infection) (CMS-HCC)       Reviewed and up to date in Epic.    Appropriateness of Therapy     Acute infections noted within Epic:  No active infections  Patient reported infection: {Blank single:19197::None,***- patient reported to provider,***- pharmacy reported to provider}    Is medication and dose appropriate based on diagnosis and infection status? {Blank single:19197::Yes,No - evidence provided by prescriber in *** note}    Prescription has been clinically reviewed: {Blank single:19197::Yes,***}  Baseline Quality of Life Assessment      {DiseaseSpecificQOL:73897}    Financial Information     Medication Assistance provided: Copay Assistance    Anticipated copay of $0.00 reviewed with patient. Verified delivery address.    Delivery Information     Scheduled delivery date: ***    Expected start date: ***      Medication will be delivered via {Blank:19197::UPS,Next Day Courier,Same Day Courier,Clinic Courier - *** clinic,***} to the {Blank:19197::prescription,temporary} address in Epic WAM.  This shipment {Blank single:19197::will,will not} require a signature.      Explained the services we provide at [facility] and that each month we would call to set up refills.  Stressed importance of returning phone calls so that we could ensure they receive their medications in time each month.  Informed patient that we should be setting up refills 7-10 days prior to when they will run out of medication.  A pharmacist will reach out to perform a clinical assessment periodically.  Informed patient that a welcome packet, containing information about our pharmacy and other support services, a Notice of Privacy Practices, and a drug information handout will be sent.      The patient or caregiver noted above participated in the development of this care plan and knows that they can request review of or adjustments to the care plan at any time.      Patient or caregiver verbalized understanding of the above information as well as how to contact the pharmacy at [phone] option 4 with any questions/concerns.  The pharmacy is open Monday through Friday 8:30am-4:30pm.  A pharmacist is available 24/7 via pager to answer any clinical questions they may have.    Patient Specific Needs     Does the patient have any physical, cognitive, or cultural barriers? {Blank single:19197::No,Yes - ***}    Does the patient have adequate living arrangements? (i.e. the ability to store and take their medication appropriately) {Blank single:19197::Yes,No - ***}    Did you identify any home environmental safety or security hazards? {Blank single:19197::No,Yes - ***}    Patient prefers to have medications discussed with  {Blank single:19197::Patient,Family Member,Caregiver,Other}     Is the patient or caregiver able to read and understand education materials at a high school level or above? {Blank single:19197::No,Yes}    Patient's primary language is  {Blank single:19197::English,Spanish,***}     Is the patient high risk? {sschighriskpts:78327}    SOCIAL DETERMINANTS OF HEALTH     At the [facility], we have learned that life circumstances - like trouble affording food, housing, utilities, or transportation can affect the health of many of our patients.   That is why we wanted to ask: are you currently experiencing any life circumstances that are negatively impacting your health and/or quality of life? {YES/NO/PATIENTDECLINED:93004}    Social Determinants of Health     Financial Resource Strain: Not on file   Internet Connectivity: Not on file   Food Insecurity: Not on file   Tobacco Use: Low Risk  (07/28/2022)    Received from [facility]    Patient History     Smoking Tobacco Use: Never     Smokeless Tobacco Use: Never     Passive Exposure: Not on file   Housing/Utilities: Not on file   Alcohol Use: Not on file   Transportation Needs: Not on file   Substance Use: Not on file   Health Literacy: Not on file   Physical Activity: Not on file  Interpersonal Safety: Not on file   Stress: Not on file   Intimate Partner Violence: Not on file   Depression: Not at risk (04/28/2020)    PHQ-2     PHQ-2 Score: 0   Social Connections: Not on file       Would you be willing to receive help with any of the needs that you have identified today? {Yes/No/Not applicable:93005}       Roderic Palau, PharmD  [facility] Specialty Pharmacist

## 2022-12-24 NOTE — Unmapped (Addendum)
Follow-up Call:    Called and spoke with Jasmine December a pharmacist at [facility]. She feels like there are red flags on this patient and would like insight before she fills it. He has not refilled this med since December. Now wants a refill. Didn't really explain why he he has not been taking it and now wants it again.         ----- Message from Franki Cabot sent at 12/23/2022  4:27 PM EDT -----  The PAC has received an incoming clinical call:    Caller name: [facility]   If not the patient, relationship to the patient  Best callback number: [phone]  Describe the reason for the call: has a question about medication that patient is trying to fill  Was an appointment offered as a placeholder?      Thanks!

## 2022-12-24 NOTE — Unmapped (Signed)
Specialty Medication(s): Delstrigo 100-300-300mg     Kerry Foley has been dis-enrolled from the [facility] specialty pharmacy services due to a pharmacy change. The patient is now filling at [facility] .    Additional information provided to the patient: I told him to give the [facility] a call if he would like [place] to fill and ship the medication to him.    Roderic Palau, PharmD  [facility] Specialty Pharmacist

## 2022-12-26 DIAGNOSIS — Z202 Contact with and (suspected) exposure to infections with a predominantly sexual mode of transmission: Principal | ICD-10-CM

## 2022-12-26 DIAGNOSIS — B20 Human immunodeficiency virus [HIV] disease: Principal | ICD-10-CM

## 2022-12-26 MED ORDER — DEXTROAMPHETAMINE-AMPHETAMINE ER 20 MG 24HR CAPSULE,EXTEND RELEASE
ORAL_CAPSULE | Freq: Every morning | ORAL | 0 refills | 30 days | Status: CP
Start: 2022-12-26 — End: ?

## 2022-12-26 MED ORDER — DELSTRIGO 100 MG-300 MG-300 MG TABLET
ORAL_TABLET | Freq: Every day | ORAL | 11 refills | 0 days | Status: CP
Start: 2022-12-26 — End: ?

## 2022-12-26 MED ORDER — DEXTROAMPHETAMINE-AMPHETAMINE 10 MG TABLET
ORAL_TABLET | Freq: Every day | ORAL | 0 refills | 60 days | Status: CP | PRN
Start: 2022-12-26 — End: ?

## 2022-12-26 MED ORDER — DOXYCYCLINE MONOHYDRATE 100 MG CAPSULE
ORAL_CAPSULE | Freq: Once | ORAL | 2 refills | 15 days | Status: CP | PRN
Start: 2022-12-26 — End: ?

## 2023-01-10 ENCOUNTER — Other Ambulatory Visit (HOSPITAL_BASED_OUTPATIENT_CLINIC_OR_DEPARTMENT_OTHER): Payer: Self-pay

## 2023-01-10 ENCOUNTER — Emergency Department (HOSPITAL_BASED_OUTPATIENT_CLINIC_OR_DEPARTMENT_OTHER)
Admission: EM | Admit: 2023-01-10 | Discharge: 2023-01-10 | Disposition: A | Discharge status: Home / Self Care | Payer: BC Managed Care – PPO | Attending: Emergency Medicine | Admitting: Emergency Medicine

## 2023-01-10 ENCOUNTER — Encounter (HOSPITAL_BASED_OUTPATIENT_CLINIC_OR_DEPARTMENT_OTHER): Payer: Self-pay

## 2023-01-10 ENCOUNTER — Other Ambulatory Visit: Payer: Self-pay

## 2023-01-10 DIAGNOSIS — Z21 Asymptomatic human immunodeficiency virus [HIV] infection status: Secondary | ICD-10-CM | POA: Insufficient documentation

## 2023-01-10 DIAGNOSIS — R21 Rash and other nonspecific skin eruption: Secondary | ICD-10-CM | POA: Insufficient documentation

## 2023-01-10 MED ORDER — PREDNISONE 20 MG PO TABS
ORAL_TABLET | ORAL | 0 refills | Status: AC
  Filled 2023-01-10: qty 20, 12d supply, fill #0

## 2023-01-10 MED ORDER — HYDROXYZINE HCL 25 MG PO TABS
25.0000 mg | ORAL_TABLET | Freq: Four times a day (QID) | ORAL | 0 refills | Status: AC
  Filled 2023-01-10: qty 12, 3d supply, fill #0

## 2023-01-10 NOTE — Discharge Instructions (Signed)
Please read and follow all provided instructions.  Your diagnoses today include:  1. Rash    Tests performed today include: Vital signs. See below for your results today.   Medications prescribed:  Prednisone - steroid medicine   It is best to take this medication in the morning to prevent sleeping problems. If you are diabetic, monitor your blood sugar closely and stop taking Prednisone if blood sugar is over 300. Take with food to prevent stomach upset.   Hydroxyzine - antihistamine  You can find this medication over-the-counter.   This medication will make you drowsy. DO NOT drive or perform any activities that require you to be awake and alert if taking this.  Take any prescribed medications only as directed.  Home care instructions:  Follow any educational materials contained in this packet.  BE VERY CAREFUL not to take multiple medicines containing Tylenol (also called acetaminophen). Doing so can lead to an overdose which can damage your liver and cause liver failure and possibly death.   Follow-up instructions: Please follow-up with your primary care provider in the next 3 days for further evaluation of your symptoms.   Return instructions:  Please return to the Emergency Department if you experience worsening symptoms.  Please return if you have any other emergent concerns.  Additional Information:  Your vital signs today were: BP (!) 151/94 (BP Location: Right Arm)   Pulse 86   Temp 97.9 F (36.6 C) (Oral)   Resp 16   Ht 6\' 1"  (1.854 m)   Wt 119.7 kg   SpO2 100%   BMI 34.83 kg/m  If your blood pressure (BP) was elevated above 135/85 this visit, please have this repeated by your doctor within one month. --------------

## 2023-01-10 NOTE — ED Triage Notes (Signed)
[place] the beach. When he got home he broke out with an itchy rash all over. He stated that the people that went with him also have a rash.

## 2023-01-10 NOTE — ED Provider Notes (Signed)
[place] EMERGENCY DEPARTMENT AT [facility] Provider Note   CSN: [id number] Arrival date & time: 01/10/23  4098     History  Chief Complaint  Patient presents with   Rash    Jordan Grimes is a 32 y.o. male.  Patient presents today for the evaluation of a rash which he has had on his arms for the past 3 days.  He has a history of HIV, no diabetes.  Symptoms began after returning from [facility].  Rash is very itchy and has been having difficulty sleeping due to the itching.  No fevers.  No tick bites.  Rash does not involve the legs, palms, or soles.  He denies new skin or soap exposures.  He states that his sister had a similar rash.  He has tried oral antihistamines and topical steroid creams without improvement.       Home Medications Prior to Admission medications   Medication Sig Start Date End Date Taking? Authorizing Provider  hydrOXYzine (ATARAX) 25 MG tablet Take 1 tablet (25 mg total) by mouth every 6 (six) hours. 01/10/23  Yes Renne Crigler, PA-C  predniSONE (DELTASONE) 20 MG tablet 3 Tabs PO Days 1-3, then 2 tabs PO Days 4-6, then 1 tab PO Day 7-9, then Half Tab PO Day 10-12 01/10/23  Yes Renne Crigler, PA-C  albuterol (PROVENTIL HFA;VENTOLIN HFA) 108 (90 Base) MCG/ACT inhaler Inhale 2 puffs into the lungs every 4 (four) hours as needed for wheezing or shortness of breath. 06/29/18   Joy, Shawn C, PA-C  amphetamine-dextroamphetamine (ADDERALL XR) 10 MG 24 hr capsule Take 10 mg by mouth every morning. 03/23/21   [provider]  benzonatate (TESSALON) 100 MG capsule Take 1 capsule (100 mg total) by mouth every 8 (eight) hours. 09/17/21   Palumbo, April, MD  dicyclomine (BENTYL) 20 MG tablet Take 1 tablet (20 mg total) by mouth 2 (two) times daily. 04/16/21   Palumbo, April, MD  doravirin-lamivudin-tenofov df (DELSTRIGO) 100-300-300 MG TABS per tablet Take 1 tablet by mouth daily. 03/22/21   [provider]  losartan (COZAAR) 25 MG tablet  Take 25 mg by mouth daily.    [provider]  naproxen (NAPROSYN) 500 MG tablet Take 1 tablet (500 mg total) by mouth 2 (two) times daily. 10/18/19   Horton, Mayer Masker, MD  omeprazole (PRILOSEC) 20 MG capsule Take 1 capsule (20 mg total) by mouth daily. 04/16/21   Palumbo, April, MD  ondansetron (ZOFRAN) 4 MG tablet Take 1 tablet (4 mg total) by mouth every 8 (eight) hours as needed for nausea or vomiting. 11/21/22   Darrick Grinder, PA-C      Allergies    Patient has no known allergies.    Review of Systems   Review of Systems  Physical Exam Updated Vital Signs BP (!) 151/94 (BP Location: Right Arm)   Pulse 86   Temp 97.9 F (36.6 C) (Oral)   Resp 16   Ht 6\' 1"  (1.854 m)   Wt 119.7 kg   SpO2 100%   BMI 34.83 kg/m  Physical Exam Vitals and nursing note reviewed.  Constitutional:      Appearance: He is well-developed.  HENT:     Head: Normocephalic and atraumatic.     Mouth/Throat:     Comments: No oropharyngeal lesions. Eyes:     Conjunctiva/sclera: Conjunctivae normal.  Pulmonary:     Effort: No respiratory distress.  Musculoskeletal:     Cervical back: Normal range of motion and  neck supple.  Skin:    General: Skin is warm and dry.     Comments: Patient with slightly raised and discrete rash including the upper extremities.  His back is clear.  Neurological:     Mental Status: He is alert.     ED Results / Procedures / Treatments   Labs (all labs ordered are listed, but only abnormal results are displayed) Labs Reviewed - No data to display  EKG None  Radiology No results found.  Procedures Procedures    Medications Ordered in ED Medications - No data to display  ED Course/ Medical Decision Making/ A&P    Patient seen and examined. History obtained directly from patient.   Labs/EKG: None ordered  Imaging: None ordered  Medications/Fluids: None ordered  Most recent vital signs reviewed and are as follows: BP (!) 151/94 (BP Location:  Right Arm)   Pulse 86   Temp 97.9 F (36.6 C) (Oral)   Resp 16   Ht 6\' 1"  (1.854 m)   Wt 119.7 kg   SpO2 100%   BMI 34.83 kg/m   Initial impression: Rash, nonspecific, allergic in appearance  Home treatment plan: Avoidance of triggers, prescription for prednisone taper and for hydroxyzine  Return instructions discussed with patient: Worsening symptoms  Follow-up instructions discussed with patient: PCP follow-up as needed                             Medical Decision Making Risk Prescription drug management.   Patient with nonspecific itchy rash.  Appears allergic.  No signs of cellulitis.  No signs of anaphylaxis.  No infectious symptoms.  No recent tick bites.  No signs of Stevens-Johnson syndrome or TEN.        Final Clinical Impression(s) / ED Diagnoses Final diagnoses:  Rash    Rx / DC Orders ED Discharge Orders          Ordered    predniSONE (DELTASONE) 20 MG tablet        01/10/23 0915    hydrOXYzine (ATARAX) 25 MG tablet  Every 6 hours        01/10/23 0915              Renne Crigler, PA-C 01/10/23 0920    Horton, Clabe Seal, DO 01/11/23 0820

## 2023-01-16 ENCOUNTER — Ambulatory Visit: Admit: 2023-01-16 | Discharge: 2023-01-17 | Payer: PRIVATE HEALTH INSURANCE | Attending: Family | Primary: Family

## 2023-01-16 DIAGNOSIS — Z9189 Other specified personal risk factors, not elsewhere classified: Principal | ICD-10-CM

## 2023-01-16 DIAGNOSIS — Z23 Encounter for immunization: Principal | ICD-10-CM

## 2023-01-16 DIAGNOSIS — Z1159 Encounter for screening for other viral diseases: Principal | ICD-10-CM

## 2023-01-16 DIAGNOSIS — Z79899 Other long term (current) drug therapy: Principal | ICD-10-CM

## 2023-01-16 DIAGNOSIS — B20 Human immunodeficiency virus [HIV] disease: Principal | ICD-10-CM

## 2023-01-16 DIAGNOSIS — Z5181 Encounter for therapeutic drug level monitoring: Principal | ICD-10-CM

## 2023-01-16 DIAGNOSIS — Z113 Encounter for screening for infections with a predominantly sexual mode of transmission: Principal | ICD-10-CM

## 2023-01-23 ENCOUNTER — Telehealth
Admit: 2023-01-23 | Payer: PRIVATE HEALTH INSURANCE | Attending: Student in an Organized Health Care Education/Training Program | Primary: Student in an Organized Health Care Education/Training Program

## 2023-01-28 ENCOUNTER — Other Ambulatory Visit: Payer: Self-pay

## 2023-01-28 ENCOUNTER — Encounter (HOSPITAL_BASED_OUTPATIENT_CLINIC_OR_DEPARTMENT_OTHER): Payer: Self-pay | Admitting: Urology

## 2023-01-28 ENCOUNTER — Emergency Department (HOSPITAL_BASED_OUTPATIENT_CLINIC_OR_DEPARTMENT_OTHER)
Admission: EM | Admit: 2023-01-28 | Discharge: 2023-01-28 | Disposition: A | Discharge status: Home / Self Care | Payer: BC Managed Care – PPO | Attending: Emergency Medicine | Admitting: Emergency Medicine

## 2023-01-28 ENCOUNTER — Other Ambulatory Visit (HOSPITAL_BASED_OUTPATIENT_CLINIC_OR_DEPARTMENT_OTHER): Payer: Self-pay

## 2023-01-28 DIAGNOSIS — N3001 Acute cystitis with hematuria: Secondary | ICD-10-CM | POA: Diagnosis not present

## 2023-01-28 DIAGNOSIS — I10 Essential (primary) hypertension: Secondary | ICD-10-CM | POA: Insufficient documentation

## 2023-01-28 DIAGNOSIS — R319 Hematuria, unspecified: Secondary | ICD-10-CM | POA: Diagnosis present

## 2023-01-28 LAB — URINALYSIS, ROUTINE W REFLEX MICROSCOPIC
Bilirubin Urine: NEGATIVE
Glucose, UA: NEGATIVE mg/dL
Ketones, ur: NEGATIVE mg/dL
Nitrite: NEGATIVE
Protein, ur: 30 mg/dL — AB
Specific Gravity, Urine: >=1.03 (ref 1.005–1.030)
pH: 5.5 (ref 5.0–8.0)

## 2023-01-28 LAB — URINALYSIS, MICROSCOPIC (REFLEX)

## 2023-01-28 MED ORDER — SULFAMETHOXAZOLE-TRIMETHOPRIM 800-160 MG PO TABS
1.0000 | ORAL_TABLET | Freq: Two times a day (BID) | ORAL | 0 refills | Status: AC
  Filled 2023-01-28: qty 14, 7d supply, fill #0

## 2023-01-28 NOTE — ED Notes (Signed)
Called lab for urine add on's.

## 2023-01-28 NOTE — ED Triage Notes (Signed)
Pt states woke up this am and noticed blood in urine, reports burning with urination as well  Denies any pain at this time  Family history of kidney cancer and pt is concerned for same

## 2023-01-28 NOTE — ED Notes (Signed)

## 2023-01-28 NOTE — ED Provider Notes (Signed)
[place] [facility] AT [facility] Provider Note   CSN: [id number] Arrival date & time: 01/28/23  1456     History  Chief Complaint  Patient presents with   Hematuria    Jordan Grimes is a 32 y.o. male.   Hematuria  Patient presents with hematuria.  States dysuria and blood this morning but also suprapubic pain.  Patient is worried because there is a family history of kidney cancer.  Is on Doxy prophylactically after sex for STD prevention.  States he took 1 this morning.  No fevers.  Good CD4 count with his history of HIV.    Past Medical History:  Diagnosis Date   Hypertension   HIV  Home Medications Prior to Admission medications   Medication Sig Start Date End Date Taking? Authorizing Provider  sulfamethoxazole-trimethoprim (BACTRIM DS) 800-160 MG tablet Take 1 tablet by mouth 2 (two) times daily for 7 days. 01/28/23 02/04/23 Yes Benjiman Core, MD  albuterol (PROVENTIL HFA;VENTOLIN HFA) 108 (90 Base) MCG/ACT inhaler Inhale 2 puffs into the lungs every 4 (four) hours as needed for wheezing or shortness of breath. 06/29/18   Joy, Shawn C, PA-C  amphetamine-dextroamphetamine (ADDERALL XR) 10 MG 24 hr capsule Take 10 mg by mouth every morning. 03/23/21   [provider]  benzonatate (TESSALON) 100 MG capsule Take 1 capsule (100 mg total) by mouth every 8 (eight) hours. 09/17/21   Palumbo, April, MD  dicyclomine (BENTYL) 20 MG tablet Take 1 tablet (20 mg total) by mouth 2 (two) times daily. 04/16/21   Palumbo, April, MD  doravirin-lamivudin-tenofov df (DELSTRIGO) 100-300-300 MG TABS per tablet Take 1 tablet by mouth daily. 03/22/21   [provider]  hydrOXYzine (ATARAX) 25 MG tablet Take 1 tablet (25 mg total) by mouth every 6 (six) hours. 01/10/23   Renne Crigler, PA-C  losartan (COZAAR) 25 MG tablet Take 25 mg by mouth daily.    [provider]  naproxen (NAPROSYN) 500 MG tablet Take 1 tablet (500 mg total) by mouth 2 (two)  times daily. 10/18/19   Horton, Mayer Masker, MD  omeprazole (PRILOSEC) 20 MG capsule Take 1 capsule (20 mg total) by mouth daily. 04/16/21   Palumbo, April, MD  ondansetron (ZOFRAN) 4 MG tablet Take 1 tablet (4 mg total) by mouth every 8 (eight) hours as needed for nausea or vomiting. 11/21/22   Darrick Grinder, PA-C      Allergies    Patient has no known allergies.    Review of Systems   Review of Systems  Genitourinary:  Positive for hematuria.    Physical Exam Updated Vital Signs BP (!) 144/103 (BP Location: Left Arm)   Pulse 87   Temp 98.3 F (36.8 C)   Resp 18   Ht 6\' 1"  (1.854 m)   Wt 118.4 kg   SpO2 97%   BMI 34.43 kg/m  Physical Exam Vitals and nursing note reviewed.  Genitourinary:    Comments: Mild suprapubic tenderness. Skin:    Capillary Refill: Capillary refill takes less than 2 seconds.  Neurological:     Mental Status: He is alert and oriented to person, place, and time.     ED Results / Procedures / Treatments   Labs (all labs ordered are listed, but only abnormal results are displayed) Labs Reviewed  URINALYSIS, ROUTINE W REFLEX MICROSCOPIC - Abnormal; Notable for the following components:      Result Value   APPearance HAZY (*)    Hgb urine dipstick MODERATE (*)  Protein, ur 30 (*)    Leukocytes,Ua SMALL (*)    All other components within normal limits  URINALYSIS, MICROSCOPIC (REFLEX) - Abnormal; Notable for the following components:   Bacteria, UA RARE (*)    All other components within normal limits  URINE CULTURE  GC/CHLAMYDIA PROBE AMP ([place]) NOT AT [facility]    EKG None  Radiology No results found.  Procedures Procedures    Medications Ordered in ED Medications - No data to display  ED Course/ Medical Decision Making/ A&P                             Medical Decision Making Amount and/or Complexity of Data Reviewed Labs: ordered.  Risk Prescription drug management.   Patient with dysuria.  Hematuria.  Differential  diagnose includes UTI, kidney stones, hematuria, cancer.  I think most likely infection with the dysuria.  Will send culture but will treat with Bactrim.  Also will send STD testing but will treat for enteric organisms.  Did have recent negative STD test but has had intercourse since.  Urine culture can be followed by infectious disease if needed.  Will discharge home.        Final Clinical Impression(s) / ED Diagnoses Final diagnoses:  Acute cystitis with hematuria    Rx / DC Orders ED Discharge Orders          Ordered    sulfamethoxazole-trimethoprim (BACTRIM DS) 800-160 MG tablet  2 times daily        01/28/23 1603              Benjiman Core, MD 01/28/23 2321

## 2023-01-28 NOTE — Discharge Instructions (Addendum)
You were treated for urinary tract infection.  Infectious disease can follow the culture if needed you will be notified if the STD testing comes back positive.  If hematuria does not improve and the culture is negative you may need urology follow-up.

## 2023-01-29 LAB — GC/CHLAMYDIA PROBE AMP (~~LOC~~) NOT AT ARMC
Chlamydia: NEGATIVE
Comment: NEGATIVE
Comment: NORMAL
Neisseria Gonorrhea: NEGATIVE

## 2023-01-29 LAB — URINE CULTURE: Culture: <10000 — AB

## 2023-03-06 ENCOUNTER — Other Ambulatory Visit (HOSPITAL_BASED_OUTPATIENT_CLINIC_OR_DEPARTMENT_OTHER): Payer: Self-pay

## 2023-03-06 ENCOUNTER — Telehealth
Admit: 2023-03-06 | Discharge: 2023-03-07 | Payer: PRIVATE HEALTH INSURANCE | Attending: Psychiatry | Primary: Psychiatry

## 2023-03-06 DIAGNOSIS — F4310 Post-traumatic stress disorder, unspecified: Principal | ICD-10-CM

## 2023-03-06 DIAGNOSIS — F900 Attention-deficit hyperactivity disorder, predominantly inattentive type: Principal | ICD-10-CM

## 2023-03-06 MED ORDER — AMPHETAMINE-DEXTROAMPHET ER 20 MG PO CP24
20.0000 mg | ORAL_CAPSULE | Freq: Two times a day (BID) | ORAL | 0 refills | Status: DC
  Filled 2023-03-06: qty 160, 80d supply, fill #0
  Filled 2023-03-06: qty 20, 10d supply, fill #0
  Filled 2023-03-07: qty 180, 90d supply, fill #0

## 2023-03-06 MED ORDER — DEXTROAMPHETAMINE-AMPHETAMINE ER 20 MG 24HR CAPSULE,EXTEND RELEASE
ORAL_CAPSULE | 0 refills | 0.00000 days | Status: CP
Start: 2023-03-06 — End: 2023-03-06

## 2023-03-07 ENCOUNTER — Other Ambulatory Visit (HOSPITAL_BASED_OUTPATIENT_CLINIC_OR_DEPARTMENT_OTHER): Payer: Self-pay

## 2023-03-07 ENCOUNTER — Other Ambulatory Visit (HOSPITAL_COMMUNITY): Payer: Self-pay

## 2023-03-08 ENCOUNTER — Other Ambulatory Visit (HOSPITAL_COMMUNITY): Payer: Self-pay

## 2023-03-23 IMAGING — DX DG CHEST 2V
2 series · 2 of 2 positions shown · non-contrast
Comparison: 11/24/2020

CLINICAL DATA: Cough

EXAM:
CHEST - 2 VIEW

[chest pa]
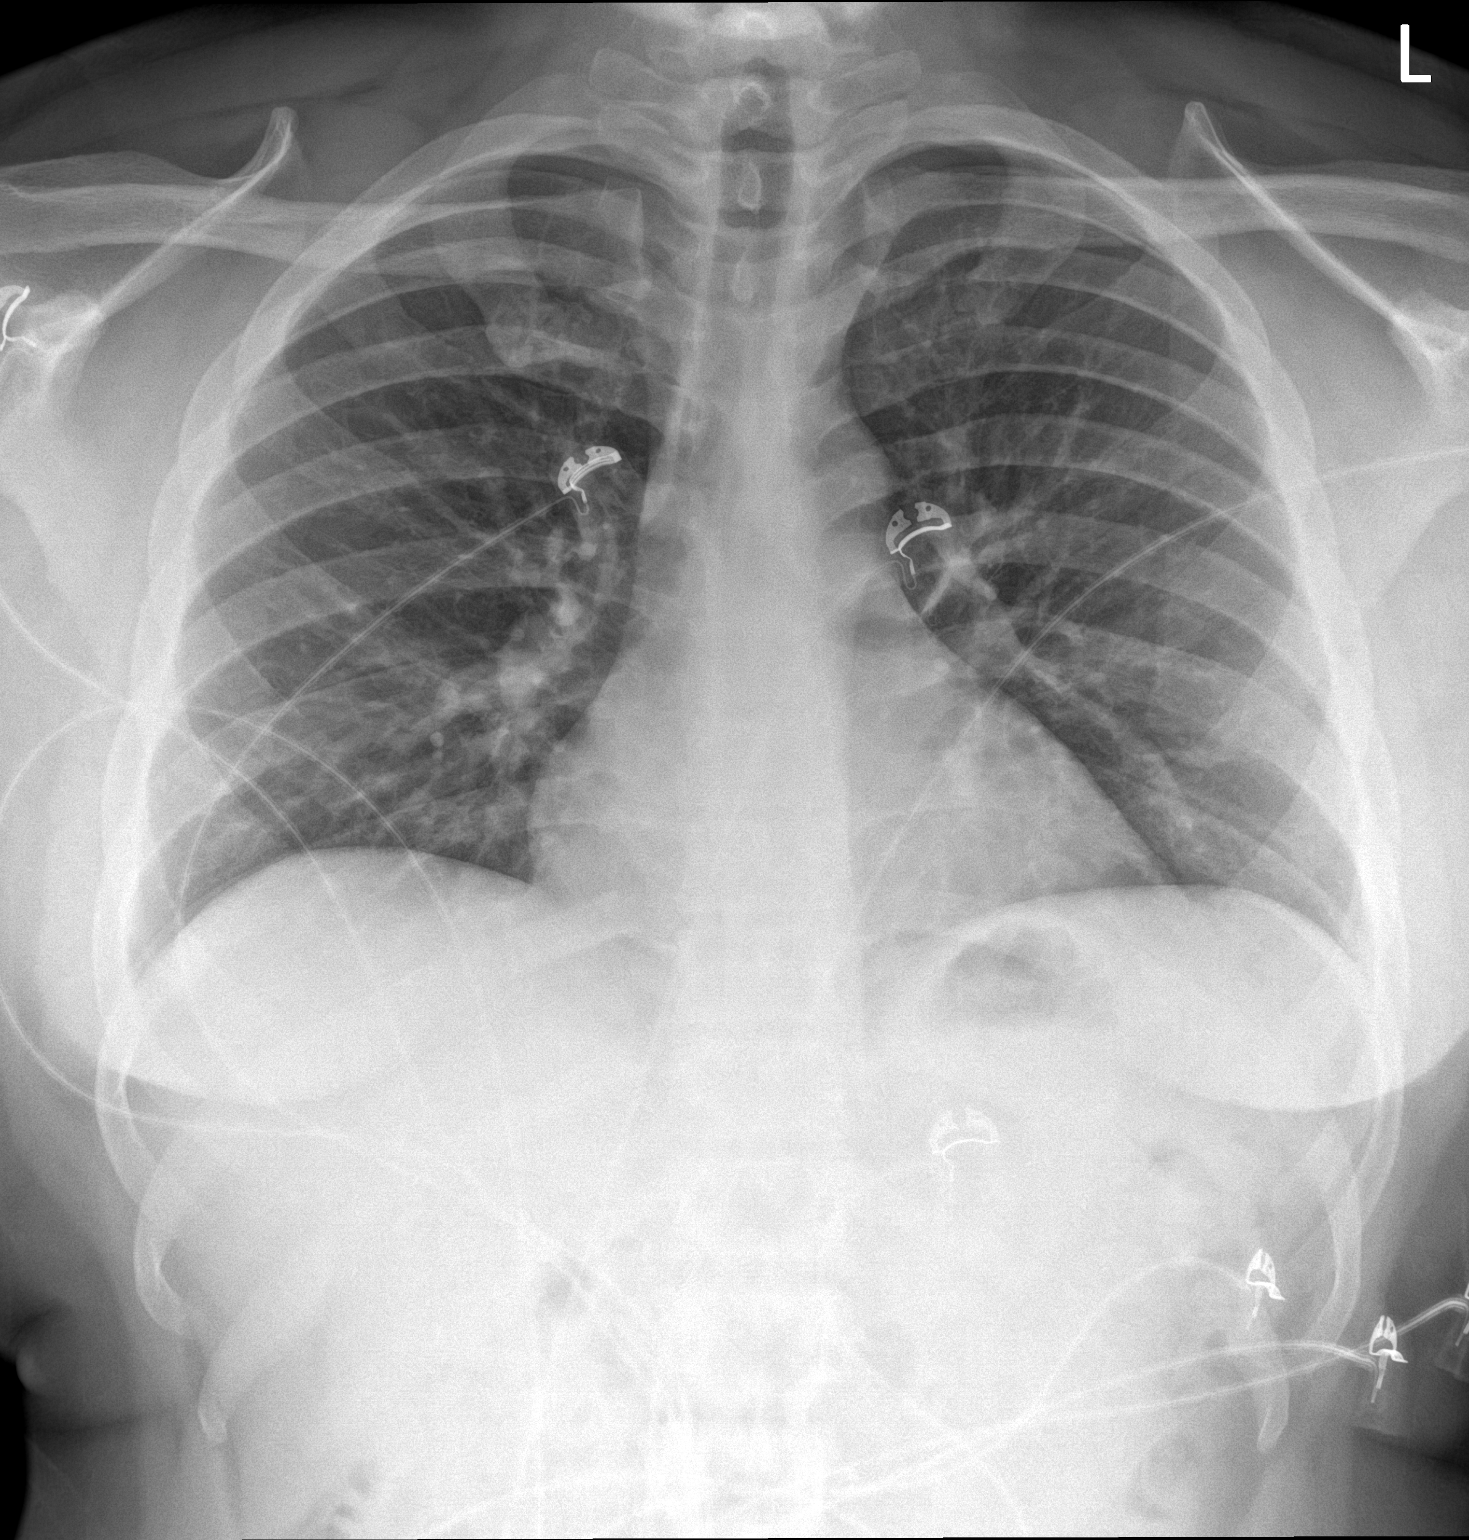

[chest lat]
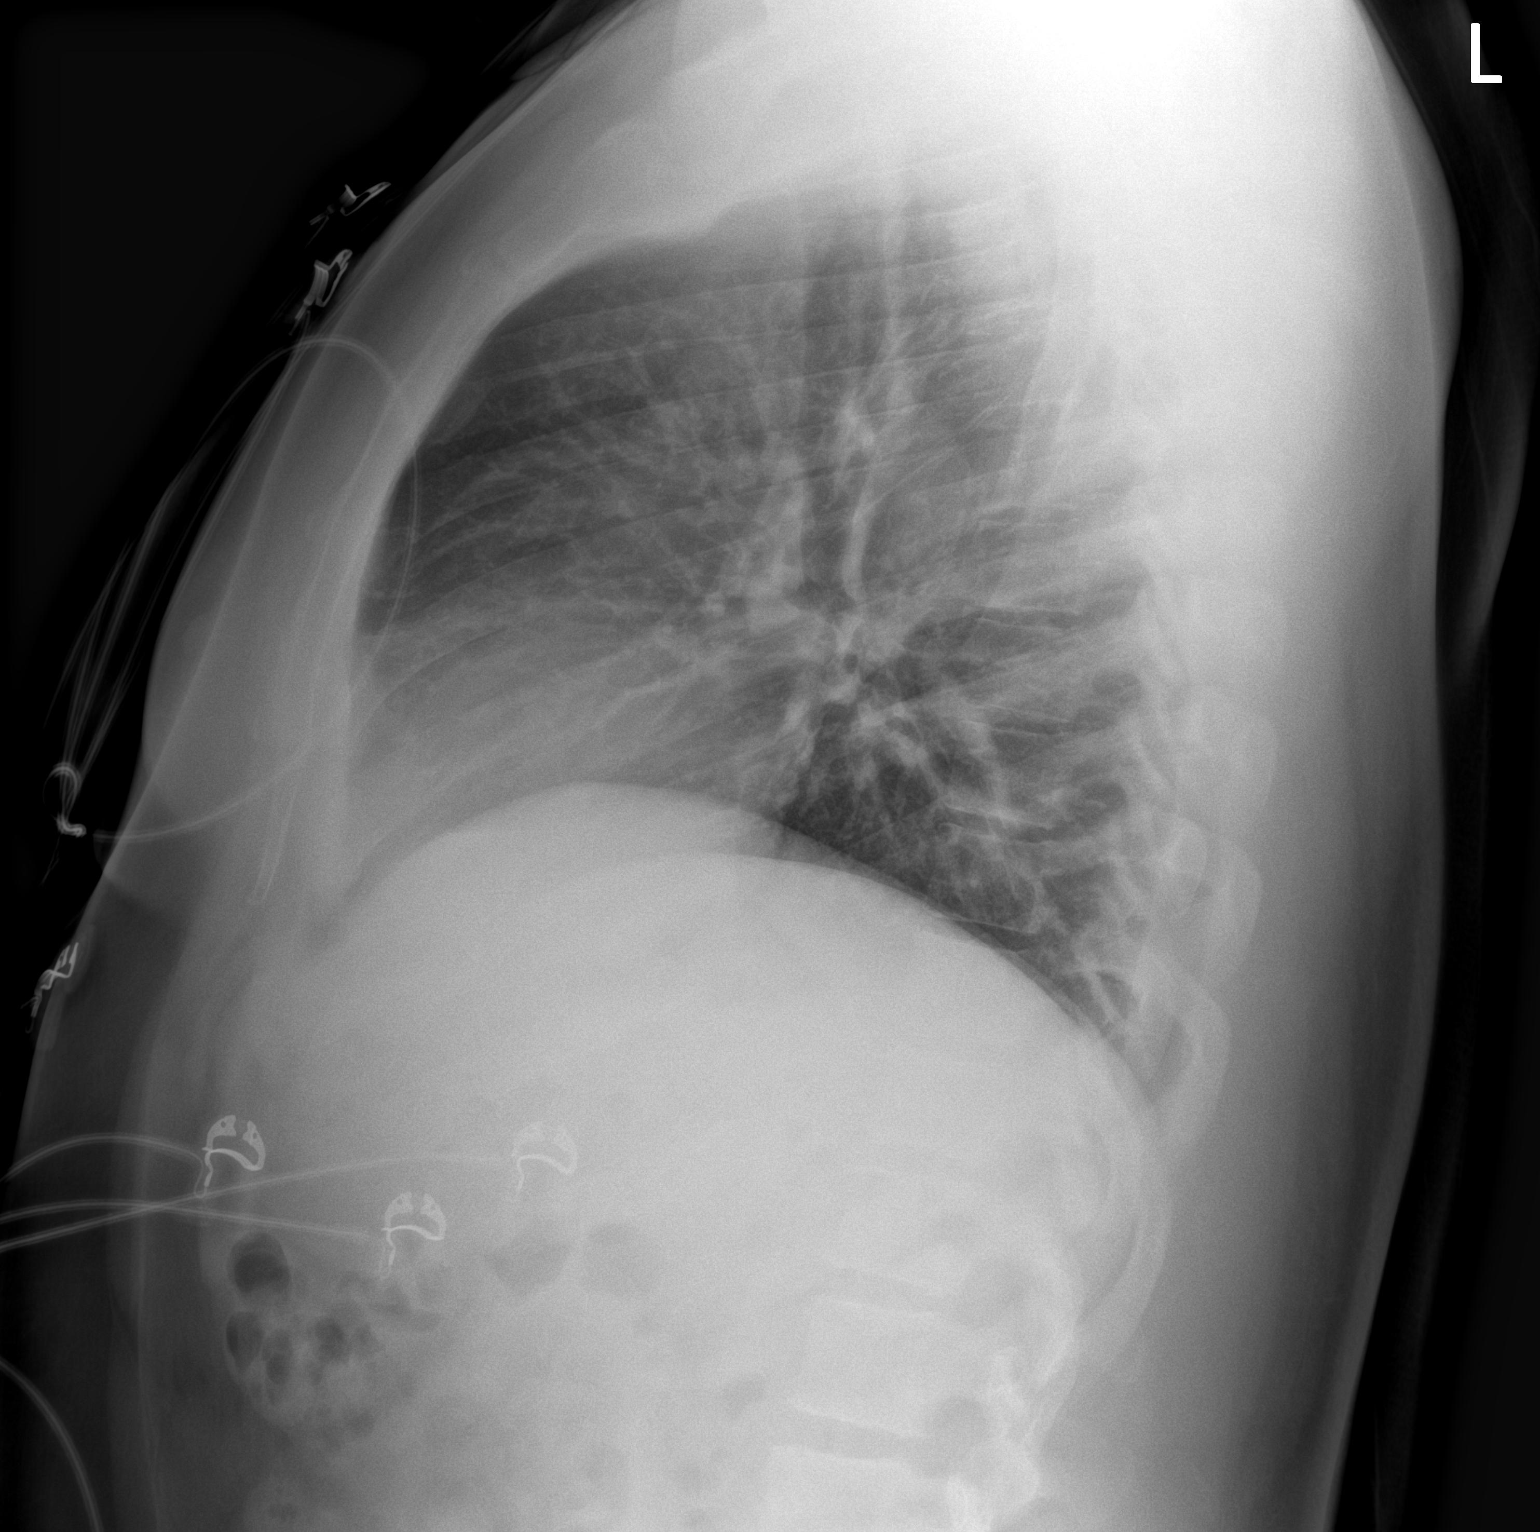

[2 of 2 positions shown; findings below may reference images not displayed]

FINDINGS: Lungs are clear.  No pleural effusion or pneumothorax.

The heart is normal in size.

Visualized osseous structures are within normal limits.
IMPRESSION: Normal chest radiographs.

## 2023-03-24 ENCOUNTER — Other Ambulatory Visit (HOSPITAL_BASED_OUTPATIENT_CLINIC_OR_DEPARTMENT_OTHER): Payer: Self-pay

## 2023-03-24 IMAGING — CT CT ANGIO CHEST
2 of 7 series · 17 of 46 positions shown · IV contrast (agent unspecified)
Comparison: Chest radiograph dated 09/16/2021

CLINICAL DATA: Cough

EXAM:
CT ANGIOGRAPHY CHEST WITH CONTRAST
TECHNIQUE: Multidetector CT imaging of the chest was performed using the
standard protocol during bolus administration of intravenous
contrast. Multiplanar CT image reconstructions and MIPs were
obtained to evaluate the vascular anatomy.

[Series 5: pe axial thins · axial · 0.92mm/px · z∈[+1224,+1472]mm · 14 of 288 slices shown]
[im 20/288  lung]
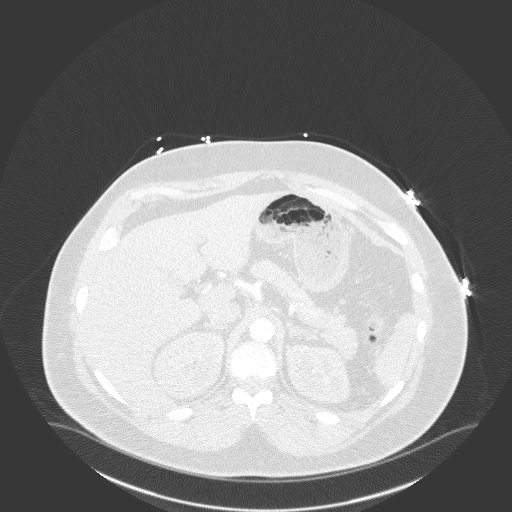
[im 39/288  soft-tissue]
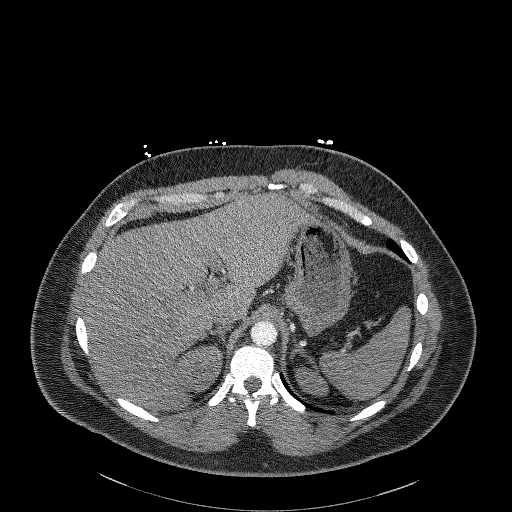
[im 58/288  lung]
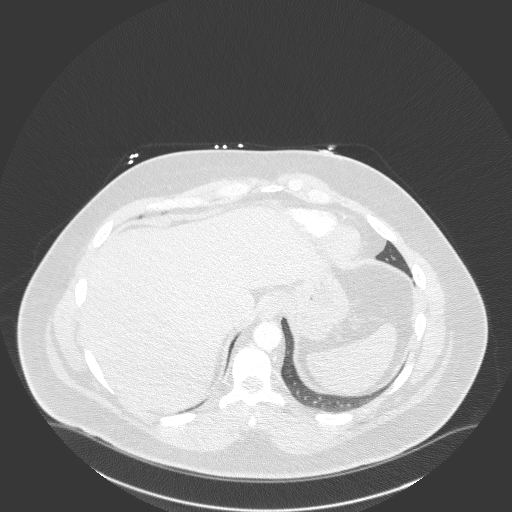
[im 77/288  soft-tissue]
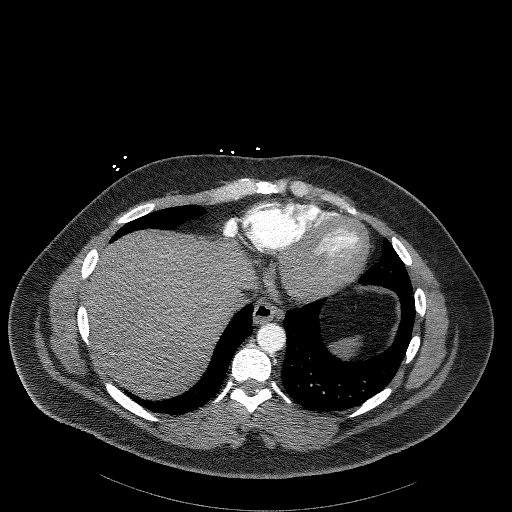
[im 96/288  lung]
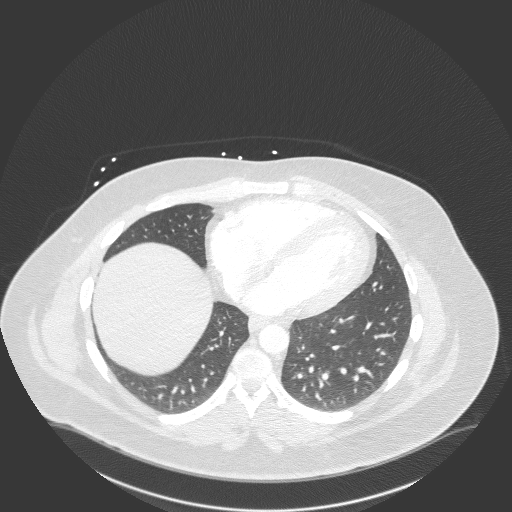
[im 115/288  soft-tissue]
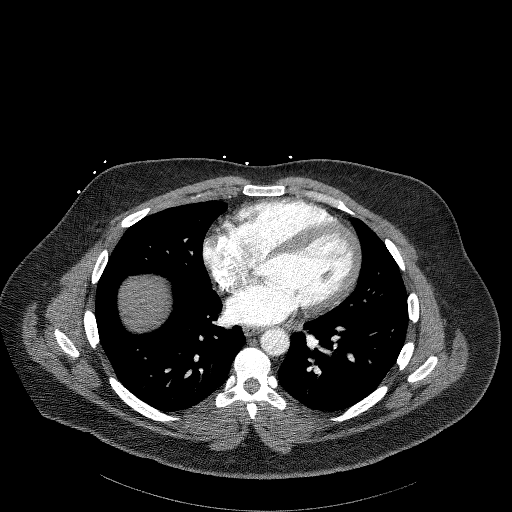
[im 134/288  lung]
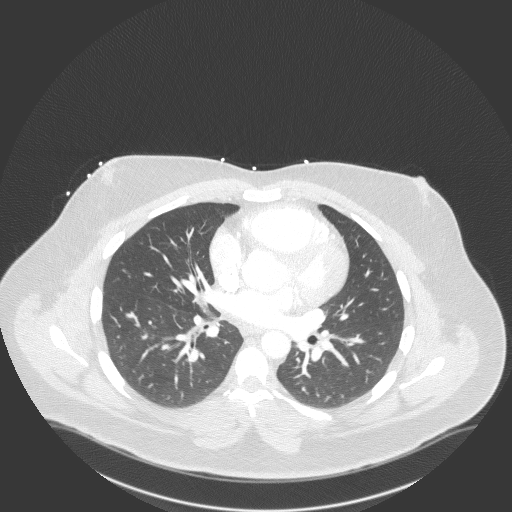
[im 154/288  soft-tissue]
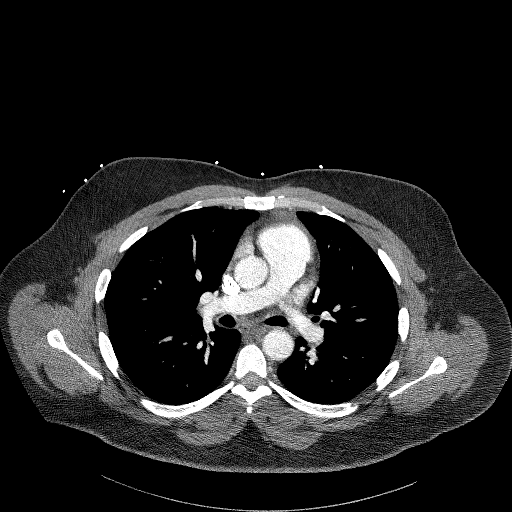
[im 173/288  lung]
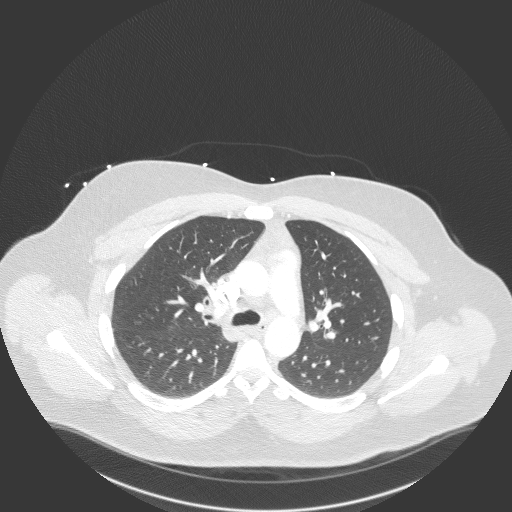
[im 192/288  soft-tissue]
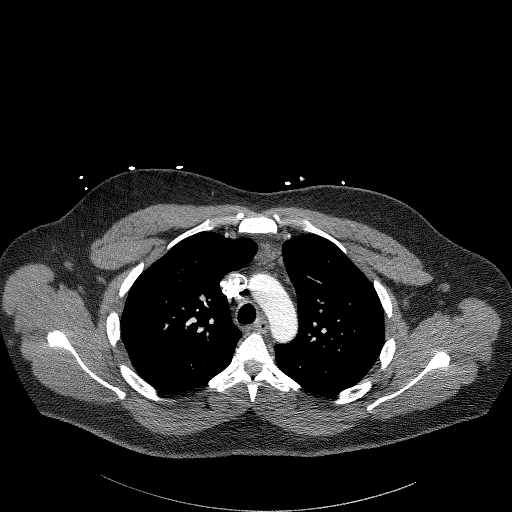
[im 211/288  lung]
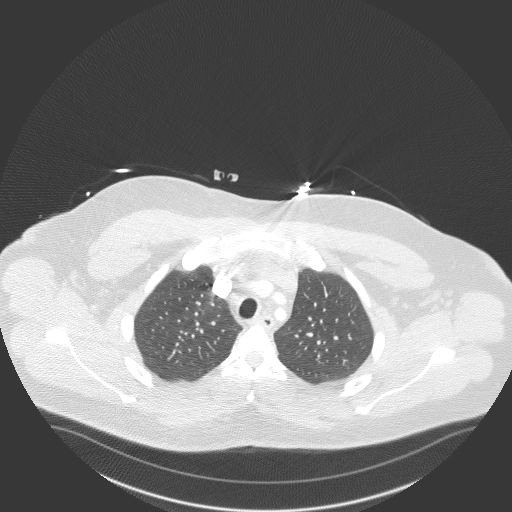
[im 230/288  soft-tissue]
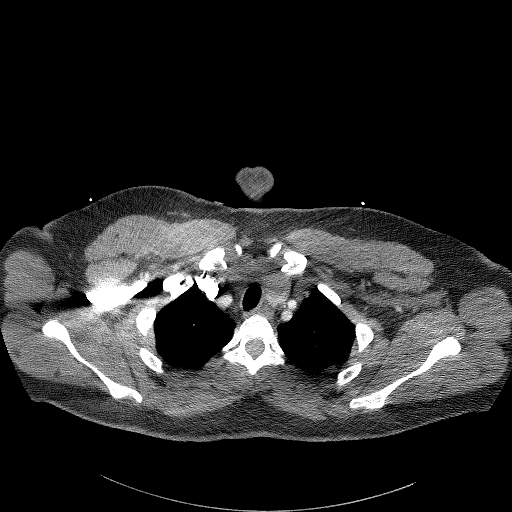
[im 249/288  lung]
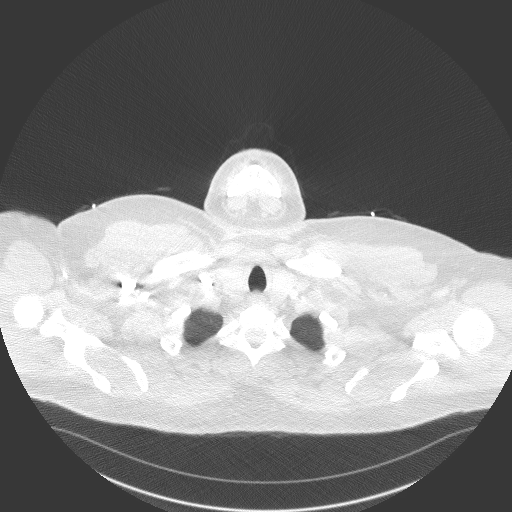
[im 268/288  soft-tissue]
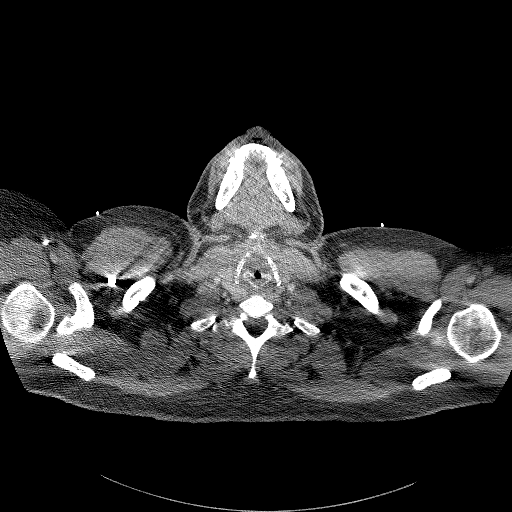

[Series 7: cor soft · coronal · 0.61mm/px · 3 of 174 slices shown]
[im 44/174  soft-tissue]
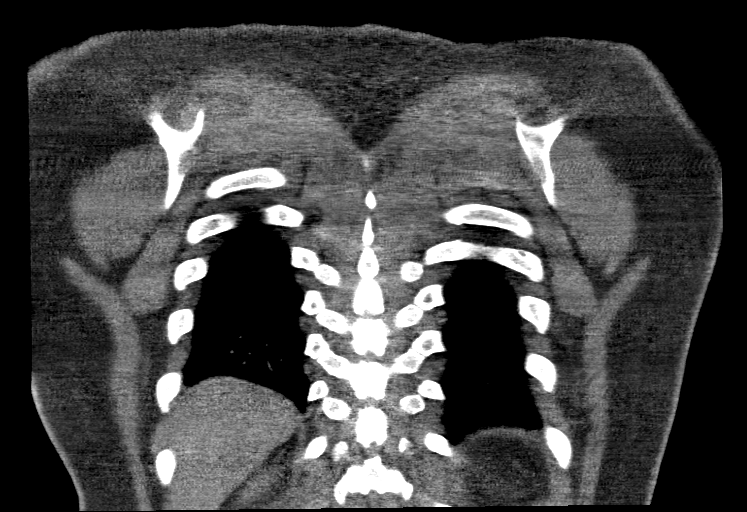
[im 87/174  soft-tissue]
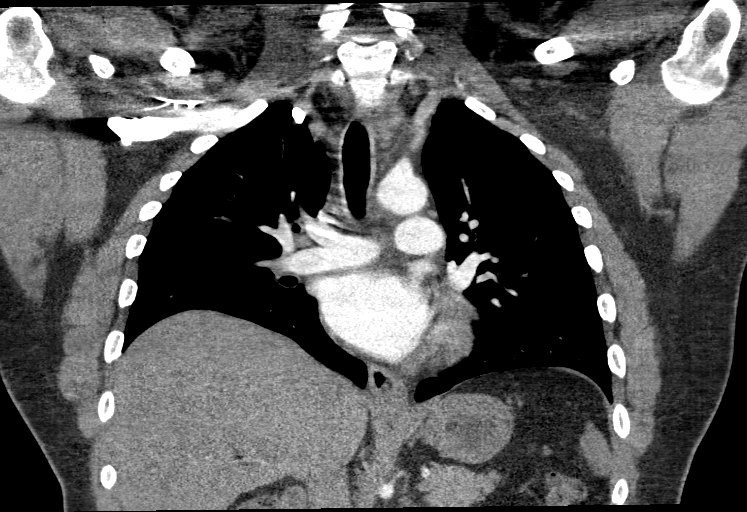
[im 130/174  soft-tissue]
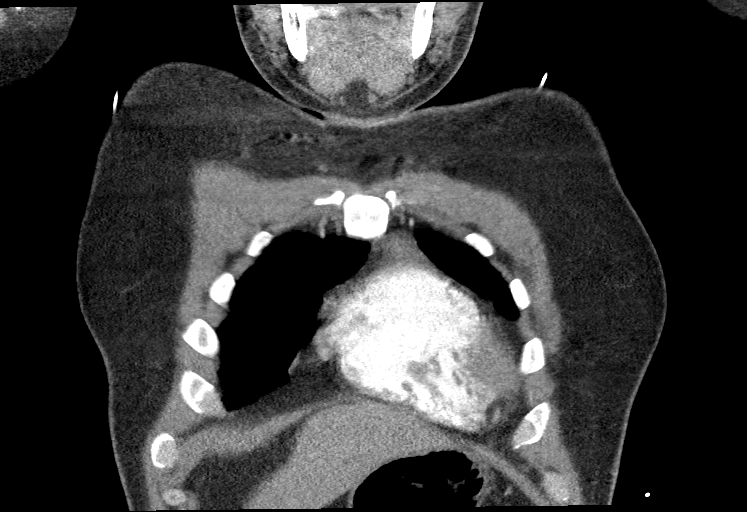

[17 of 46 positions shown; findings below may reference images not displayed]

RADIATION DOSE REDUCTION: This exam was performed according to the
departmental dose-optimization program which includes automated
exposure control, adjustment of the mA and/or kV according to
patient size and/or use of iterative reconstruction technique.

CONTRAST:  73mL OMNIPAQUE IOHEXOL 350 MG/ML SOLN
FINDINGS: Cardiovascular: Satisfactory opacification the bilateral pulmonary
arteries to the segmental level. No evidence of pulmonary embolism.

Although not tailored for evaluation of the thoracic aorta, there is
no evidence of thoracic aortic aneurysm or dissection.

The heart is normal in size.  No pericardial effusion.

Mediastinum/Nodes: Residual thymic tissue along the anterior
mediastinum.

No suspicious mediastinal lymphadenopathy.

Visualized thyroid is unremarkable.

Lungs/Pleura: No suspicious pulmonary nodules.

No focal consolidation.

No pleural effusion or pneumothorax.

Upper Abdomen: Visualized upper abdomen is grossly unremarkable.

Musculoskeletal: Visualized osseous structures are within normal
limits.

Review of the MIP images confirms the above findings.
IMPRESSION: No evidence of pulmonary embolism.

Negative CT chest.

## 2023-04-17 ENCOUNTER — Ambulatory Visit: Admit: 2023-04-17 | Discharge: 2023-04-18 | Payer: PRIVATE HEALTH INSURANCE | Attending: Family | Primary: Family

## 2023-04-17 DIAGNOSIS — Z79899 Other long term (current) drug therapy: Principal | ICD-10-CM

## 2023-04-17 DIAGNOSIS — Z23 Encounter for immunization: Principal | ICD-10-CM

## 2023-04-17 DIAGNOSIS — Z202 Contact with and (suspected) exposure to infections with a predominantly sexual mode of transmission: Principal | ICD-10-CM

## 2023-04-17 DIAGNOSIS — B20 Human immunodeficiency virus [HIV] disease: Principal | ICD-10-CM

## 2023-04-17 DIAGNOSIS — Z113 Encounter for screening for infections with a predominantly sexual mode of transmission: Principal | ICD-10-CM

## 2023-04-17 DIAGNOSIS — Z8619 Personal history of other infectious and parasitic diseases: Principal | ICD-10-CM

## 2023-04-17 DIAGNOSIS — Z5181 Encounter for therapeutic drug level monitoring: Principal | ICD-10-CM

## 2023-04-17 DIAGNOSIS — Z9189 Other specified personal risk factors, not elsewhere classified: Principal | ICD-10-CM

## 2023-04-17 MED ORDER — DOXYCYCLINE MONOHYDRATE 100 MG CAPSULE
ORAL_CAPSULE | Freq: Once | ORAL | 2 refills | 15 days | Status: CP | PRN
Start: 2023-04-17 — End: ?

## 2023-04-17 MED ORDER — DELSTRIGO 100 MG-300 MG-300 MG TABLET
ORAL_TABLET | Freq: Every day | ORAL | 3 refills | 0 days | Status: CP
Start: 2023-04-17 — End: ?

## 2023-04-25 ENCOUNTER — Other Ambulatory Visit: Payer: Self-pay

## 2023-04-25 ENCOUNTER — Emergency Department (HOSPITAL_BASED_OUTPATIENT_CLINIC_OR_DEPARTMENT_OTHER)
Admission: EM | Admit: 2023-04-25 | Discharge: 2023-04-26 | Disposition: A | Discharge status: Home / Self Care | Payer: BC Managed Care – PPO | Attending: Emergency Medicine | Admitting: Emergency Medicine

## 2023-04-25 ENCOUNTER — Encounter (HOSPITAL_BASED_OUTPATIENT_CLINIC_OR_DEPARTMENT_OTHER): Payer: Self-pay | Admitting: Emergency Medicine

## 2023-04-25 ENCOUNTER — Emergency Department (HOSPITAL_COMMUNITY): Payer: BC Managed Care – PPO

## 2023-04-25 DIAGNOSIS — N50811 Right testicular pain: Secondary | ICD-10-CM | POA: Insufficient documentation

## 2023-04-25 DIAGNOSIS — I10 Essential (primary) hypertension: Secondary | ICD-10-CM | POA: Insufficient documentation

## 2023-04-25 DIAGNOSIS — Z79899 Other long term (current) drug therapy: Secondary | ICD-10-CM | POA: Diagnosis not present

## 2023-04-25 LAB — URINALYSIS, MICROSCOPIC (REFLEX)

## 2023-04-25 LAB — URINALYSIS, ROUTINE W REFLEX MICROSCOPIC
Bilirubin Urine: NEGATIVE
Glucose, UA: NEGATIVE mg/dL
Ketones, ur: NEGATIVE mg/dL
Nitrite: NEGATIVE
Protein, ur: NEGATIVE mg/dL
Specific Gravity, Urine: >=1.03 (ref 1.005–1.030)
pH: 6 (ref 5.0–8.0)

## 2023-04-25 NOTE — ED Triage Notes (Addendum)
R testicular swelling when he woke up. Hx of this when very young. Pain radiates to upper ABD. Walking makes the pain worse. States hurt where had appendectomy. He was around 15 when appendix removed.

## 2023-04-25 NOTE — ED Provider Notes (Signed)
[facility] Provider Note  CSN: [id number] Arrival date & time: 04/25/23 2115  Chief Complaint(s) Testicle Pain  HPI Jordan Grimes is a 32 y.o. male history of hypertension presenting to the emergency department with right testicle pain.  Reports that radiates up to his abdomen.  No dysuria, fevers or chills, nausea or vomiting.  Reports recent STD testing which was negative.     Past Medical History Past Medical History:  Diagnosis Date   Hypertension    There are no problems to display for this patient.  Home Medication(s) Prior to Admission medications   Medication Sig Start Date End Date Taking? Authorizing Provider  albuterol (PROVENTIL HFA;VENTOLIN HFA) 108 (90 Base) MCG/ACT inhaler Inhale 2 puffs into the lungs every 4 (four) hours as needed for wheezing or shortness of breath. 06/29/18   Joy, Shawn C, PA-C  amphetamine-dextroamphetamine (ADDERALL XR) 10 MG 24 hr capsule Take 10 mg by mouth every morning. 03/23/21   [provider]  amphetamine-dextroamphetamine (ADDERALL XR) 20 MG 24 hr capsule Take 1 capsule by mouth every morning and 1 daily at noon.  Do not take closer than 8 hours prior to bedtime. 03/06/23     benzonatate (TESSALON) 100 MG capsule Take 1 capsule (100 mg total) by mouth every 8 (eight) hours. 09/17/21   Palumbo, April, MD  dicyclomine (BENTYL) 20 MG tablet Take 1 tablet (20 mg total) by mouth 2 (two) times daily. 04/16/21   Palumbo, April, MD  doravirin-lamivudin-tenofov df (DELSTRIGO) 100-300-300 MG TABS per tablet Take 1 tablet by mouth daily. 03/22/21   [provider]  hydrOXYzine (ATARAX) 25 MG tablet Take 1 tablet (25 mg total) by mouth every 6 (six) hours. 01/10/23   Renne Crigler, PA-C  losartan (COZAAR) 25 MG tablet Take 25 mg by mouth daily.    [provider]  naproxen (NAPROSYN) 500 MG tablet Take 1 tablet (500 mg total) by mouth 2 (two) times daily. 10/18/19   Horton, Mayer Masker, MD  omeprazole (PRILOSEC) 20 MG capsule Take 1 capsule (20 mg total) by mouth daily. 04/16/21   Palumbo, April, MD  ondansetron (ZOFRAN) 4 MG tablet Take 1 tablet (4 mg total) by mouth every 8 (eight) hours as needed for nausea or vomiting. 11/21/22   Pamala Duffel                                                                                                                                    Past Surgical History Past Surgical History:  Procedure Laterality Date   ANKLE SURGERY     APPENDECTOMY     HERNIA REPAIR     MENISCUS REPAIR     Family History History reviewed. No pertinent family history.  Social History Social History   Tobacco Use   Smoking status: Never   Smokeless tobacco: Never  Vaping Use   Vaping status: Never  Used  Substance Use Topics   Alcohol use: Yes    Comment: occ   Drug use: Yes    Types: Marijuana   Allergies Patient has no known allergies.  Review of Systems Review of Systems  All other systems reviewed and are negative.   Physical Exam Vital Signs  I have reviewed the triage vital signs BP (!) 157/91 (BP Location: Left Arm)   Pulse 93   Temp 98.4 F (36.9 C) (Oral)   Resp 16   Ht 6\' 1"  (1.854 m)   Wt 117.9 kg   SpO2 100%   BMI 34.30 kg/m  Physical Exam Vitals and nursing note reviewed.  Constitutional:      General: He is not in acute distress.    Appearance: Normal appearance.  HENT:     Head: Normocephalic and atraumatic.     Mouth/Throat:     Mouth: Mucous membranes are moist.  Eyes:     Conjunctiva/sclera: Conjunctivae normal.  Cardiovascular:     Rate and Rhythm: Normal rate.  Pulmonary:     Effort: Pulmonary effort is normal. No respiratory distress.  Abdominal:     General: Abdomen is flat.     Tenderness: There is no abdominal tenderness.  Genitourinary:    Comments: Chaperoned by RN, right testicle enlarged versus left with tenderness Skin:    General: Skin is warm and dry.     Capillary Refill:  Capillary refill takes less than 2 seconds.  Neurological:     General: No focal deficit present.     Mental Status: He is alert. Mental status is at baseline.  Psychiatric:        Mood and Affect: Mood normal.        Behavior: Behavior normal.     ED Results and Treatments Labs (all labs ordered are listed, but only abnormal results are displayed) Labs Reviewed  URINALYSIS, ROUTINE W REFLEX MICROSCOPIC - Abnormal; Notable for the following components:      Result Value   APPearance CLOUDY (*)    Hgb urine dipstick TRACE (*)    Leukocytes,Ua MODERATE (*)    All other components within normal limits  URINALYSIS, MICROSCOPIC (REFLEX) - Abnormal; Notable for the following components:   Bacteria, UA MANY (*)    All other components within normal limits  URINE CULTURE  GC/CHLAMYDIA PROBE AMP ([place]) NOT AT [facility]                                                                                                                          Radiology No results found.  Pertinent labs & imaging results that were available during my care of the patient were reviewed by me and considered in my medical decision making (see MDM for details).  Medications Ordered in ED Medications - No data to display  Procedures Procedures  (including critical care time)  Medical Decision Making / ED Course   MDM:  32 year old male presenting with right testicular pain.  On exam, patient has enlargement of the right testicle with some tenderness.  Given age, suspect more likely cause of his symptoms are epididymitis rather than torsion but unable to rule this out clinically, will need ultrasound.  Ultrasound not available currently at [facility].  Discussed with Dr. Maple Hudson at [facility] who is accepted the patient for ER to ER transfer for ultrasound.  Discussed with  patient who is in agreement. Patient refused toradol/ibuprofen      Lab Tests: -I ordered, reviewed, and interpreted labs.   The pertinent results include:   Labs Reviewed  URINALYSIS, ROUTINE W REFLEX MICROSCOPIC - Abnormal; Notable for the following components:      Result Value   APPearance CLOUDY (*)    Hgb urine dipstick TRACE (*)    Leukocytes,Ua MODERATE (*)    All other components within normal limits  URINALYSIS, MICROSCOPIC (REFLEX) - Abnormal; Notable for the following components:   Bacteria, UA MANY (*)    All other components within normal limits  URINE CULTURE  GC/CHLAMYDIA PROBE AMP ([place]) NOT AT [facility]    Notable for bacturia    Medicines ordered and prescription drug management: No orders of the defined types were placed in this encounter.   -I have reviewed the patients home medicines and have made adjustments as needed Co morbidities that complicate the patient evaluation  Past Medical History:  Diagnosis Date   Hypertension       Dispostion: Disposition decision including need for hospitalization was considered, and patient transferred.    Final Clinical Impression(s) / ED Diagnoses Final diagnoses:  Pain in right testicle     This chart was dictated using voice recognition software.  Despite best efforts to proofread,  errors can occur which can change the documentation meaning.    Lonell Grandchild, MD 04/25/23 2212

## 2023-04-25 NOTE — ED Notes (Signed)
Pt directed to go to [facility] for further evaluation and testing. Pt verbalized understanding, ambulatory to ED exit.

## 2023-04-26 NOTE — ED Notes (Signed)
Called patient multiple times no response

## 2023-04-27 LAB — URINE CULTURE: Culture: NO GROWTH

## 2023-04-28 LAB — GC/CHLAMYDIA PROBE AMP (~~LOC~~) NOT AT ARMC
Chlamydia: NEGATIVE
Comment: NEGATIVE
Comment: NORMAL
Neisseria Gonorrhea: NEGATIVE

## 2023-05-29 ENCOUNTER — Other Ambulatory Visit (HOSPITAL_BASED_OUTPATIENT_CLINIC_OR_DEPARTMENT_OTHER): Payer: Self-pay

## 2023-05-29 ENCOUNTER — Telehealth
Admit: 2023-05-29 | Discharge: 2023-05-30 | Payer: PRIVATE HEALTH INSURANCE | Attending: Psychiatry | Primary: Psychiatry

## 2023-05-29 DIAGNOSIS — F900 Attention-deficit hyperactivity disorder, predominantly inattentive type: Principal | ICD-10-CM

## 2023-05-29 MED ORDER — AMPHETAMINE-DEXTROAMPHET ER 20 MG PO CP24
20.0000 mg | ORAL_CAPSULE | Freq: Every day | ORAL | 0 refills | Status: AC
  Filled 2023-06-04: qty 90, 90d supply, fill #0

## 2023-05-29 MED ORDER — AMPHETAMINE-DEXTROAMPHETAMINE 20 MG PO TABS
20.0000 mg | ORAL_TABLET | Freq: Every day | ORAL | 0 refills | Status: AC
  Filled 2023-05-30: qty 90, 90d supply, fill #0

## 2023-05-29 MED ORDER — DEXTROAMPHETAMINE-AMPHETAMINE ER 20 MG 24HR CAPSULE,EXTEND RELEASE
ORAL_CAPSULE | 0 refills | 0 days | Status: CP
Start: 2023-05-29 — End: ?

## 2023-05-29 MED ORDER — DEXTROAMPHETAMINE-AMPHETAMINE 20 MG TABLET
ORAL_TABLET | 0 refills | 0 days | Status: CP
Start: 2023-05-29 — End: ?

## 2023-05-30 ENCOUNTER — Other Ambulatory Visit (HOSPITAL_BASED_OUTPATIENT_CLINIC_OR_DEPARTMENT_OTHER): Payer: Self-pay

## 2023-06-04 ENCOUNTER — Other Ambulatory Visit (HOSPITAL_BASED_OUTPATIENT_CLINIC_OR_DEPARTMENT_OTHER): Payer: Self-pay

## 2023-06-09 ENCOUNTER — Other Ambulatory Visit (HOSPITAL_BASED_OUTPATIENT_CLINIC_OR_DEPARTMENT_OTHER): Payer: Self-pay

## 2023-07-10 ENCOUNTER — Ambulatory Visit: Admit: 2023-07-10 | Discharge: 2023-07-11 | Payer: BLUE CROSS/BLUE SHIELD | Attending: Family | Primary: Family

## 2023-07-10 DIAGNOSIS — Z113 Encounter for screening for infections with a predominantly sexual mode of transmission: Principal | ICD-10-CM

## 2023-07-10 DIAGNOSIS — Z8619 Personal history of other infectious and parasitic diseases: Principal | ICD-10-CM

## 2023-07-11 ENCOUNTER — Ambulatory Visit: Admit: 2023-07-11 | Discharge: 2023-07-12 | Payer: BLUE CROSS/BLUE SHIELD

## 2023-08-06 DIAGNOSIS — B20 Human immunodeficiency virus [HIV] disease: Principal | ICD-10-CM

## 2023-08-06 DIAGNOSIS — Z202 Contact with and (suspected) exposure to infections with a predominantly sexual mode of transmission: Principal | ICD-10-CM

## 2023-08-06 MED ORDER — DOXYCYCLINE MONOHYDRATE 100 MG CAPSULE
ORAL_CAPSULE | Freq: Once | ORAL | 2 refills | 15.00 days | Status: CP | PRN
Start: 2023-08-06 — End: ?

## 2023-08-21 ENCOUNTER — Encounter: Admit: 2023-08-21 | Discharge: 2023-08-22 | Payer: BLUE CROSS/BLUE SHIELD | Attending: Psychiatry | Primary: Psychiatry

## 2023-08-21 DIAGNOSIS — F900 Attention-deficit hyperactivity disorder, predominantly inattentive type: Principal | ICD-10-CM

## 2023-08-21 MED ORDER — DEXTROAMPHETAMINE-AMPHETAMINE 30 MG TABLET
ORAL_TABLET | 0 refills | 0.00 days | Status: CP
Start: 2023-08-21 — End: ?

## 2023-08-21 MED ORDER — DEXTROAMPHETAMINE-AMPHETAMINE ER 30 MG 24HR CAPSULE,EXTEND RELEASE
ORAL_CAPSULE | 0 refills | 0.00 days | Status: CP
Start: 2023-08-21 — End: ?

## 2023-08-27 MED ORDER — DEXTROAMPHETAMINE-AMPHETAMINE 30 MG TABLET
ORAL_TABLET | 0 refills | 0.00 days | Status: CP
Start: 2023-08-27 — End: ?

## 2023-09-22 ENCOUNTER — Ambulatory Visit: Admit: 2023-09-22 | Discharge: 2023-09-22 | Payer: PRIVATE HEALTH INSURANCE | Attending: Family | Primary: Family

## 2023-09-22 DIAGNOSIS — Z113 Encounter for screening for infections with a predominantly sexual mode of transmission: Principal | ICD-10-CM

## 2023-09-22 DIAGNOSIS — B20 Human immunodeficiency virus [HIV] disease: Principal | ICD-10-CM

## 2023-09-22 DIAGNOSIS — Z79899 Other long term (current) drug therapy: Principal | ICD-10-CM

## 2023-09-22 DIAGNOSIS — J45909 Unspecified asthma, uncomplicated: Principal | ICD-10-CM

## 2023-09-22 DIAGNOSIS — I10 Essential (primary) hypertension: Principal | ICD-10-CM

## 2023-09-22 DIAGNOSIS — Z5181 Encounter for therapeutic drug level monitoring: Principal | ICD-10-CM

## 2023-09-22 DIAGNOSIS — Z9189 Other specified personal risk factors, not elsewhere classified: Principal | ICD-10-CM

## 2023-09-22 DIAGNOSIS — Z202 Contact with and (suspected) exposure to infections with a predominantly sexual mode of transmission: Principal | ICD-10-CM

## 2023-09-22 MED ORDER — ALBUTEROL SULFATE HFA 90 MCG/ACTUATION AEROSOL INHALER
Freq: Four times a day (QID) | RESPIRATORY_TRACT | 1 refills | 0.00 days | Status: CP | PRN
Start: 2023-09-22 — End: ?

## 2023-09-22 MED ORDER — DELSTRIGO 100 MG-300 MG-300 MG TABLET
ORAL_TABLET | Freq: Every day | ORAL | 11 refills | 0.00 days | Status: CP
Start: 2023-09-22 — End: ?

## 2023-09-22 MED ORDER — DOXYCYCLINE MONOHYDRATE 100 MG CAPSULE
ORAL_CAPSULE | Freq: Once | ORAL | 2 refills | 15.00 days | Status: CP | PRN
Start: 2023-09-22 — End: ?

## 2023-09-23 DIAGNOSIS — F900 Attention-deficit hyperactivity disorder, predominantly inattentive type: Principal | ICD-10-CM

## 2023-09-23 MED ORDER — DEXTROAMPHETAMINE-AMPHETAMINE 20 MG TABLET
ORAL_TABLET | 0 refills | 0.00 days | Status: CP
Start: 2023-09-23 — End: ?

## 2023-09-25 MED ORDER — DEXTROAMPHETAMINE-AMPHETAMINE 30 MG TABLET
ORAL_TABLET | 0 refills | 0.00 days | Status: CP
Start: 2023-09-25 — End: ?

## 2023-10-21 MED ORDER — DEXTROAMPHETAMINE-AMPHETAMINE 20 MG TABLET
ORAL_TABLET | 0 refills | 0.00 days | Status: CP
Start: 2023-10-21 — End: ?

## 2023-10-23 MED ORDER — DEXTROAMPHETAMINE-AMPHETAMINE 30 MG TABLET
ORAL_TABLET | 0 refills | 0.00 days | Status: CP
Start: 2023-10-23 — End: ?

## 2023-10-24 MED ORDER — DEXTROAMPHETAMINE-AMPHETAMINE 30 MG TABLET
ORAL_TABLET | 0 refills | 0.00 days | Status: CP
Start: 2023-10-24 — End: ?

## 2023-10-30 ENCOUNTER — Encounter
Admit: 2023-10-30 | Discharge: 2023-10-31 | Payer: PRIVATE HEALTH INSURANCE | Attending: Psychiatry | Primary: Psychiatry

## 2023-10-30 DIAGNOSIS — F900 Attention-deficit hyperactivity disorder, predominantly inattentive type: Principal | ICD-10-CM

## 2023-11-27 DIAGNOSIS — F900 Attention-deficit hyperactivity disorder, predominantly inattentive type: Principal | ICD-10-CM

## 2023-11-28 MED ORDER — DEXTROAMPHETAMINE-AMPHETAMINE 20 MG TABLET
ORAL_TABLET | 0 refills | 0.00 days | Status: CP
Start: 2023-11-28 — End: ?

## 2023-11-28 MED ORDER — DEXTROAMPHETAMINE-AMPHETAMINE 30 MG TABLET
ORAL_TABLET | 0 refills | 0.00 days | Status: CP
Start: 2023-11-28 — End: ?

## 2023-12-26 MED ORDER — DEXTROAMPHETAMINE-AMPHETAMINE 20 MG TABLET
ORAL_TABLET | 0 refills | 0.00 days | Status: CP
Start: 2023-12-26 — End: ?

## 2023-12-26 MED ORDER — DEXTROAMPHETAMINE-AMPHETAMINE 30 MG TABLET
ORAL_TABLET | 0 refills | 0.00 days | Status: CP
Start: 2023-12-26 — End: ?

## 2024-01-02 ENCOUNTER — Ambulatory Visit: Admit: 2024-01-02 | Discharge: 2024-01-03 | Payer: Medicaid (Managed Care) | Attending: Family | Primary: Family

## 2024-01-02 DIAGNOSIS — Z113 Encounter for screening for infections with a predominantly sexual mode of transmission: Principal | ICD-10-CM

## 2024-01-02 DIAGNOSIS — Z202 Contact with and (suspected) exposure to infections with a predominantly sexual mode of transmission: Principal | ICD-10-CM

## 2024-01-02 DIAGNOSIS — Z8619 Personal history of other infectious and parasitic diseases: Principal | ICD-10-CM

## 2024-01-22 ENCOUNTER — Encounter: Admit: 2024-01-22 | Discharge: 2024-01-23 | Payer: Medicaid (Managed Care) | Attending: Psychiatry | Primary: Psychiatry

## 2024-01-22 DIAGNOSIS — F900 Attention-deficit hyperactivity disorder, predominantly inattentive type: Principal | ICD-10-CM

## 2024-01-22 DIAGNOSIS — F4310 Post-traumatic stress disorder, unspecified: Principal | ICD-10-CM

## 2024-01-23 MED ORDER — DEXTROAMPHETAMINE-AMPHETAMINE 30 MG TABLET
ORAL_TABLET | ORAL | 0 refills | 0.00000 days | Status: CP
Start: 2024-01-23 — End: ?

## 2024-01-23 MED ORDER — DEXTROAMPHETAMINE-AMPHETAMINE ER 30 MG 24HR CAPSULE,EXTEND RELEASE
ORAL_CAPSULE | 0 refills | 0.00 days | Status: CP
Start: 2024-01-23 — End: ?

## 2024-01-23 MED ORDER — DEXTROAMPHETAMINE-AMPHETAMINE 20 MG TABLET
ORAL_TABLET | ORAL | 0 refills | 0.00000 days | Status: CP
Start: 2024-01-23 — End: ?

## 2024-01-23 MED ORDER — DEXTROAMPHETAMINE-AMPHETAMINE ER 20 MG 24HR CAPSULE,EXTEND RELEASE
ORAL_CAPSULE | 0 refills | 0.00 days | Status: CP
Start: 2024-01-23 — End: ?

## 2024-02-19 MED ORDER — DEXTROAMPHETAMINE-AMPHETAMINE 20 MG TABLET
ORAL_TABLET | ORAL | 0 refills | 0.00000 days | Status: CP
Start: 2024-02-19 — End: ?

## 2024-02-19 MED ORDER — DEXTROAMPHETAMINE-AMPHETAMINE 30 MG TABLET
ORAL_TABLET | ORAL | 0 refills | 0.00000 days | Status: CP
Start: 2024-02-19 — End: ?

## 2024-03-01 DIAGNOSIS — F900 Attention-deficit hyperactivity disorder, predominantly inattentive type: Principal | ICD-10-CM

## 2024-03-01 MED ORDER — DEXTROAMPHETAMINE-AMPHETAMINE 20 MG TABLET
ORAL_TABLET | ORAL | 0 refills | 0.00000 days | Status: CP
Start: 2024-03-01 — End: ?

## 2024-03-01 MED ORDER — DEXTROAMPHETAMINE-AMPHETAMINE 30 MG TABLET
ORAL_TABLET | ORAL | 0 refills | 0.00000 days | Status: CP
Start: 2024-03-01 — End: ?

## 2024-03-15 ENCOUNTER — Encounter: Admit: 2024-03-15 | Discharge: 2024-03-15 | Payer: Medicaid (Managed Care) | Attending: Family | Primary: Family

## 2024-03-15 DIAGNOSIS — Z8619 Personal history of other infectious and parasitic diseases: Principal | ICD-10-CM

## 2024-03-15 DIAGNOSIS — B20 Human immunodeficiency virus [HIV] disease: Principal | ICD-10-CM

## 2024-03-15 DIAGNOSIS — Z113 Encounter for screening for infections with a predominantly sexual mode of transmission: Principal | ICD-10-CM

## 2024-03-15 DIAGNOSIS — Z9189 Other specified personal risk factors, not elsewhere classified: Principal | ICD-10-CM

## 2024-03-15 DIAGNOSIS — Z79899 Other long term (current) drug therapy: Principal | ICD-10-CM

## 2024-03-15 DIAGNOSIS — Z5181 Encounter for therapeutic drug level monitoring: Principal | ICD-10-CM

## 2024-03-15 MED ORDER — DELSTRIGO 100 MG-300 MG-300 MG TABLET
ORAL_TABLET | Freq: Every day | ORAL | 11 refills | 0.00000 days | Status: CP
Start: 2024-03-15 — End: ?

## 2024-03-19 DIAGNOSIS — Z21 Asymptomatic human immunodeficiency virus [HIV] infection status: Principal | ICD-10-CM

## 2024-03-19 DIAGNOSIS — E669 Obesity, unspecified: Principal | ICD-10-CM

## 2024-03-19 DIAGNOSIS — N62 Hypertrophy of breast: Principal | ICD-10-CM

## 2024-03-19 DIAGNOSIS — I10 Essential (primary) hypertension: Principal | ICD-10-CM

## 2024-03-19 MED ORDER — SEMAGLUTIDE (WEIGHT LOSS) 0.5 MG/0.5 ML SUBCUTANEOUS PEN INJECTOR
SUBCUTANEOUS | 0 refills | 0.00000 days | Status: CP
Start: 2024-03-19 — End: ?

## 2024-03-19 MED ORDER — SEMAGLUTIDE (WEIGHT LOSS) 1.7 MG/0.75 ML SUBCUTANEOUS PEN INJECTOR
SUBCUTANEOUS | 4 refills | 28.00000 days | Status: CP
Start: 2024-03-19 — End: ?

## 2024-03-19 MED ORDER — DEXTROAMPHETAMINE-AMPHETAMINE 30 MG TABLET
ORAL_TABLET | ORAL | 0 refills | 0.00000 days | Status: CP
Start: 2024-03-19 — End: ?

## 2024-03-19 MED ORDER — AMLODIPINE 5 MG TABLET
ORAL_TABLET | Freq: Every day | ORAL | 11 refills | 30.00000 days | Status: CP
Start: 2024-03-19 — End: 2025-03-19

## 2024-03-19 MED ORDER — LISINOPRIL 5 MG TABLET
ORAL_TABLET | Freq: Every day | ORAL | 11 refills | 30.00000 days | Status: CN
Start: 2024-03-19 — End: 2025-03-19

## 2024-03-19 MED ORDER — VERAPAMIL ER (SR) 120 MG TABLET,EXTENDED RELEASE
ORAL_TABLET | Freq: Every day | ORAL | 11 refills | 30.00000 days | Status: CN
Start: 2024-03-19 — End: 2025-03-19

## 2024-03-19 MED ORDER — SEMAGLUTIDE (WEIGHT LOSS) 1 MG/0.5 ML SUBCUTANEOUS PEN INJECTOR
SUBCUTANEOUS | 0 refills | 0.00000 days | Status: CP
Start: 2024-03-19 — End: ?

## 2024-03-19 MED ORDER — DEXTROAMPHETAMINE-AMPHETAMINE 20 MG TABLET
ORAL_TABLET | ORAL | 0 refills | 0.00000 days | Status: CP
Start: 2024-03-19 — End: ?

## 2024-03-19 MED ORDER — SEMAGLUTIDE (WEIGHT LOSS) 0.25 MG/0.5 ML SUBCUTANEOUS PEN INJECTOR
SUBCUTANEOUS | 0 refills | 0.00000 days | Status: CP
Start: 2024-03-19 — End: ?

## 2024-03-30 DIAGNOSIS — B353 Tinea pedis: Principal | ICD-10-CM

## 2024-03-30 DIAGNOSIS — L810 Postinflammatory hyperpigmentation: Principal | ICD-10-CM

## 2024-03-30 DIAGNOSIS — L219 Seborrheic dermatitis, unspecified: Principal | ICD-10-CM

## 2024-03-30 MED ORDER — HYDROQUINONE 4 % TOPICAL CREAM
Freq: Two times a day (BID) | TOPICAL | 1 refills | 0.00000 days | Status: CP
Start: 2024-03-30 — End: 2025-03-30

## 2024-03-30 MED ORDER — HYDROCORTISONE 2.5 % TOPICAL CREAM
TOPICAL | 0 refills | 0.00000 days | Status: CP
Start: 2024-03-30 — End: ?

## 2024-03-30 MED ORDER — KETOCONAZOLE 2 % TOPICAL CREAM
Freq: Two times a day (BID) | TOPICAL | 5 refills | 30.00000 days | Status: CP
Start: 2024-03-30 — End: ?

## 2024-04-01 DIAGNOSIS — F900 Attention-deficit hyperactivity disorder, predominantly inattentive type: Principal | ICD-10-CM

## 2024-04-05 ENCOUNTER — Emergency Department
Admit: 2024-04-05 | Discharge: 2024-04-05 | Disposition: A | Discharge status: Home / Self Care | Payer: Medicaid (Managed Care)

## 2024-04-14 ENCOUNTER — Ambulatory Visit: Admit: 2024-04-14 | Discharge: 2024-04-15 | Payer: Medicaid (Managed Care)

## 2024-04-14 DIAGNOSIS — N62 Hypertrophy of breast: Principal | ICD-10-CM

## 2024-04-14 DIAGNOSIS — F900 Attention-deficit hyperactivity disorder, predominantly inattentive type: Principal | ICD-10-CM

## 2024-04-14 MED ORDER — DEXTROAMPHETAMINE-AMPHETAMINE 20 MG TABLET
ORAL_TABLET | ORAL | 0 refills | 0.00000 days | Status: CP
Start: 2024-04-14 — End: ?

## 2024-04-30 MED ORDER — DEXTROAMPHETAMINE-AMPHETAMINE 20 MG TABLET
ORAL_TABLET | ORAL | 0 refills | 0.00000 days | Status: CP
Start: 2024-04-30 — End: ?

## 2024-04-30 MED ORDER — DEXTROAMPHETAMINE-AMPHETAMINE 30 MG TABLET
ORAL_TABLET | ORAL | 0 refills | 0.00000 days | Status: CP
Start: 2024-04-30 — End: ?

## 2024-05-05 ENCOUNTER — Encounter: Admit: 2024-05-05 | Discharge: 2024-05-05 | Payer: Medicaid (Managed Care) | Attending: Family | Primary: Family

## 2024-05-05 DIAGNOSIS — Z113 Encounter for screening for infections with a predominantly sexual mode of transmission: Principal | ICD-10-CM

## 2024-05-28 MED ORDER — DEXTROAMPHETAMINE-AMPHETAMINE 30 MG TABLET
ORAL_TABLET | ORAL | 0 refills | 0.00000 days | Status: CP
Start: 2024-05-28 — End: ?

## 2024-05-28 MED ORDER — DEXTROAMPHETAMINE-AMPHETAMINE 20 MG TABLET
ORAL_TABLET | ORAL | 0 refills | 0.00000 days | Status: CP
Start: 2024-05-28 — End: ?

## 2024-07-10 DIAGNOSIS — F900 Attention-deficit hyperactivity disorder, predominantly inattentive type: Principal | ICD-10-CM

## 2024-07-10 MED ORDER — DEXTROAMPHETAMINE-AMPHETAMINE 30 MG TABLET
ORAL_TABLET | 0 refills | 0.00000 days
Start: 2024-07-10 — End: ?

## 2024-07-11 DIAGNOSIS — F900 Attention-deficit hyperactivity disorder, predominantly inattentive type: Principal | ICD-10-CM

## 2024-07-11 MED ORDER — DEXTROAMPHETAMINE-AMPHETAMINE 30 MG TABLET
ORAL_TABLET | ORAL | 0 refills | 0.00000 days | Status: CP
Start: 2024-07-11 — End: ?

## 2024-07-14 DIAGNOSIS — Z202 Contact with and (suspected) exposure to infections with a predominantly sexual mode of transmission: Principal | ICD-10-CM

## 2024-07-14 DIAGNOSIS — B20 Human immunodeficiency virus [HIV] disease: Principal | ICD-10-CM

## 2024-07-14 MED ORDER — DOXYCYCLINE MONOHYDRATE 100 MG CAPSULE
ORAL_CAPSULE | Freq: Once | ORAL | 2 refills | 15.00000 days | Status: CP | PRN
Start: 2024-07-14 — End: ?

## 2024-07-30 MED ORDER — DEXTROAMPHETAMINE-AMPHETAMINE 30 MG TABLET
ORAL_TABLET | ORAL | 0 refills | 0.00000 days | Status: CP
Start: 2024-07-30 — End: ?

## 2024-08-08 MED ORDER — DEXTROAMPHETAMINE-AMPHETAMINE 20 MG TABLET
ORAL_TABLET | ORAL | 0 refills | 0.00000 days | Status: CP
Start: 2024-08-08 — End: ?

## 2024-08-10 ENCOUNTER — Encounter: Admit: 2024-08-10 | Discharge: 2024-08-10 | Payer: Medicaid (Managed Care) | Attending: Family | Primary: Family

## 2024-08-10 DIAGNOSIS — Z5181 Encounter for therapeutic drug level monitoring: Principal | ICD-10-CM

## 2024-08-10 DIAGNOSIS — Z202 Contact with and (suspected) exposure to infections with a predominantly sexual mode of transmission: Principal | ICD-10-CM

## 2024-08-10 DIAGNOSIS — Z79899 Other long term (current) drug therapy: Principal | ICD-10-CM

## 2024-08-10 DIAGNOSIS — Z113 Encounter for screening for infections with a predominantly sexual mode of transmission: Principal | ICD-10-CM

## 2024-08-10 DIAGNOSIS — Z8619 Personal history of other infectious and parasitic diseases: Principal | ICD-10-CM

## 2024-08-10 DIAGNOSIS — B20 Human immunodeficiency virus [HIV] disease: Principal | ICD-10-CM

## 2024-08-10 DIAGNOSIS — Z9189 Other specified personal risk factors, not elsewhere classified: Principal | ICD-10-CM

## 2024-08-10 MED ORDER — DOXYCYCLINE MONOHYDRATE 100 MG CAPSULE
ORAL_CAPSULE | Freq: Once | ORAL | 2 refills | 15.00000 days | Status: CP | PRN
Start: 2024-08-10 — End: ?

## 2024-08-10 MED ORDER — DELSTRIGO 100 MG-300 MG-300 MG TABLET
ORAL_TABLET | Freq: Every day | ORAL | 11 refills | 0.00000 days | Status: CP
Start: 2024-08-10 — End: ?

## 2024-09-03 ENCOUNTER — Ambulatory Visit
Admit: 2024-09-03 | Discharge: 2024-09-04 | Payer: Medicaid (Managed Care) | Attending: Student in an Organized Health Care Education/Training Program | Primary: Student in an Organized Health Care Education/Training Program

## 2024-09-03 DIAGNOSIS — N62 Hypertrophy of breast: Principal | ICD-10-CM

## 2024-09-06 MED ORDER — DEXTROAMPHETAMINE-AMPHETAMINE 20 MG TABLET
ORAL_TABLET | ORAL | 0 refills | 0.00000 days | Status: CP
Start: 2024-09-06 — End: ?

## 2024-09-06 MED ORDER — DEXTROAMPHETAMINE-AMPHETAMINE 30 MG TABLET
ORAL_TABLET | ORAL | 0 refills | 0.00000 days | Status: CP
Start: 2024-09-06 — End: ?

## 2024-09-09 DIAGNOSIS — N62 Hypertrophy of breast: Principal | ICD-10-CM
